# Patient Record
Sex: Male | Born: 1960 | Race: White | Hispanic: No | Smoking: Former smoker
Health system: Southern US, Community
[De-identification: ages and names within clinical notes are randomized; demographics above are authoritative.]

## PROBLEM LIST (undated history)

## (undated) DIAGNOSIS — G8110 Spastic hemiplegia affecting unspecified side: Secondary | ICD-10-CM

## (undated) DIAGNOSIS — F32A Depression, unspecified: Secondary | ICD-10-CM

## (undated) DIAGNOSIS — F329 Major depressive disorder, single episode, unspecified: Secondary | ICD-10-CM

## (undated) DIAGNOSIS — I639 Cerebral infarction, unspecified: Secondary | ICD-10-CM

## (undated) DIAGNOSIS — I69920 Aphasia following unspecified cerebrovascular disease: Secondary | ICD-10-CM

## (undated) DIAGNOSIS — R339 Retention of urine, unspecified: Secondary | ICD-10-CM

## (undated) DIAGNOSIS — I6330 Cerebral infarction due to thrombosis of unspecified cerebral artery: Secondary | ICD-10-CM

## (undated) DIAGNOSIS — K219 Gastro-esophageal reflux disease without esophagitis: Secondary | ICD-10-CM

## (undated) HISTORY — DX: Major depressive disorder, single episode, unspecified: F32.9

## (undated) HISTORY — DX: Depression, unspecified: F32.A

## (undated) HISTORY — DX: Retention of urine, unspecified: R33.9

## (undated) HISTORY — DX: Cerebral infarction, unspecified: I63.9

## (undated) HISTORY — DX: Spastic hemiplegia affecting unspecified side: G81.10

## (undated) HISTORY — DX: Gastro-esophageal reflux disease without esophagitis: K21.9

## (undated) HISTORY — PX: CRANIOTOMY: SHX93

## (undated) HISTORY — DX: Aphasia following unspecified cerebrovascular disease: I69.920

## (undated) HISTORY — DX: Cerebral infarction due to thrombosis of unspecified cerebral artery: I63.30

---

## 2003-09-20 ENCOUNTER — Observation Stay (HOSPITAL_COMMUNITY): Admission: RE | Admit: 2003-09-20 | Discharge: 2003-09-21 | Payer: Self-pay | Admitting: General Surgery

## 2009-12-12 ENCOUNTER — Emergency Department (HOSPITAL_COMMUNITY): Admission: EM | Admit: 2009-12-12 | Discharge: 2009-12-12 | Payer: Self-pay | Admitting: Emergency Medicine

## 2009-12-12 ENCOUNTER — Inpatient Hospital Stay (HOSPITAL_COMMUNITY): Admission: EM | Admit: 2009-12-12 | Discharge: 2010-01-15 | Payer: Self-pay | Admitting: Emergency Medicine

## 2009-12-12 ENCOUNTER — Ambulatory Visit: Payer: Self-pay | Admitting: Pulmonary Disease

## 2009-12-13 ENCOUNTER — Encounter (INDEPENDENT_AMBULATORY_CARE_PROVIDER_SITE_OTHER): Payer: Self-pay | Admitting: Neurology

## 2009-12-28 ENCOUNTER — Ambulatory Visit: Payer: Self-pay | Admitting: Physical Medicine & Rehabilitation

## 2010-01-30 ENCOUNTER — Encounter: Admission: RE | Admit: 2010-01-30 | Discharge: 2010-01-30 | Payer: Self-pay | Admitting: Neurosurgery

## 2010-03-14 ENCOUNTER — Encounter: Admission: RE | Admit: 2010-03-14 | Discharge: 2010-03-14 | Payer: Self-pay | Admitting: Neurosurgery

## 2010-03-25 ENCOUNTER — Inpatient Hospital Stay (HOSPITAL_COMMUNITY): Admission: RE | Admit: 2010-03-25 | Discharge: 2010-04-08 | Payer: Self-pay | Admitting: Neurosurgery

## 2010-03-28 ENCOUNTER — Ambulatory Visit: Payer: Self-pay | Admitting: Physical Medicine & Rehabilitation

## 2010-10-14 ENCOUNTER — Encounter
Admission: RE | Admit: 2010-10-14 | Discharge: 2010-11-28 | Payer: Self-pay | Source: Home / Self Care | Attending: Neurosurgery | Admitting: Neurosurgery

## 2010-11-20 ENCOUNTER — Encounter: Admission: RE | Admit: 2010-11-20 | Payer: Self-pay | Source: Home / Self Care | Admitting: Neurosurgery

## 2010-12-04 ENCOUNTER — Encounter
Admission: RE | Admit: 2010-12-04 | Discharge: 2010-12-31 | Payer: Self-pay | Source: Home / Self Care | Attending: Neurosurgery | Admitting: Neurosurgery

## 2010-12-11 IMAGING — CT CT HEAD WO/W CM
3 of 5 series · 17 of 30 positions shown, 19 images · IV contrast (agent unspecified)
Comparison: 12/19/2009.

CLINICAL DATA: Intracranial hemorrhage follow-up.

CT HEAD WITHOUT AND WITH CONTRAST
TECHNIQUE: Contiguous axial images were obtained from the base of
the skull through the vertex without and with intravenous contrast.
Contrast: 80 ml Vmnipaque-011 IV.

[Series 4: head routine 4.8 h60s · axial · 0.46mm/px · z∈[-1079,-957]mm · 6 of 36 slices shown, 8 images (1 of 2)]
[im 6/36  brain]
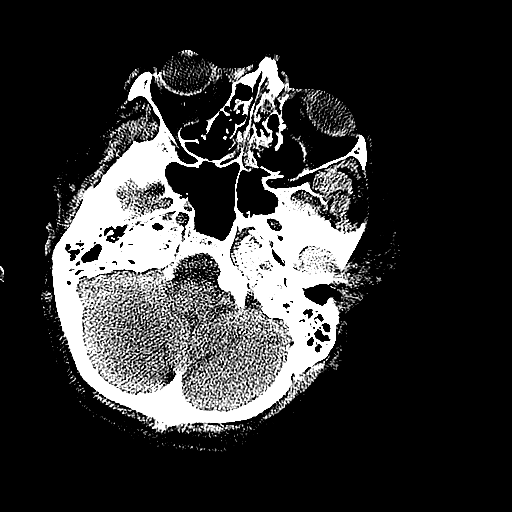
[im 6/36  bone]
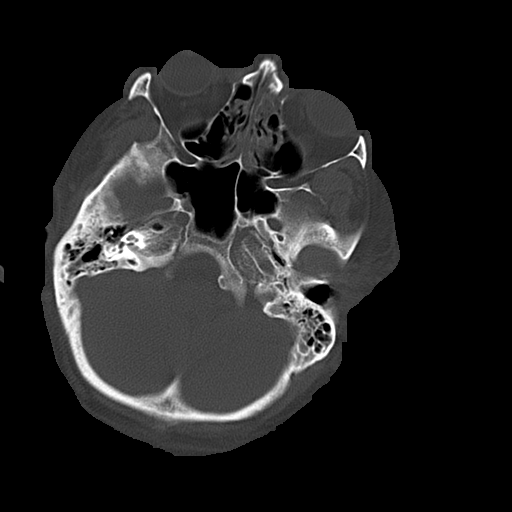
[im 11/36  brain]
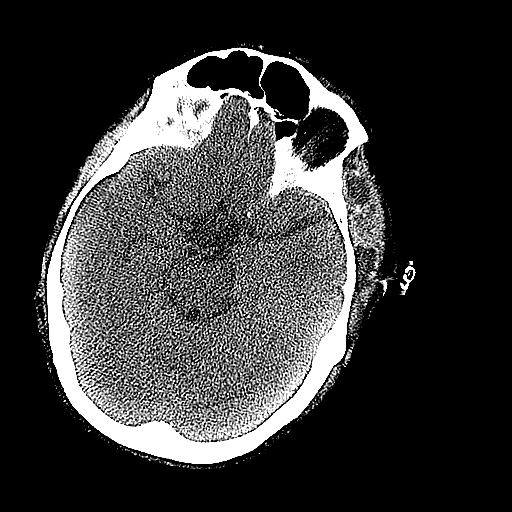
[im 16/36  brain]
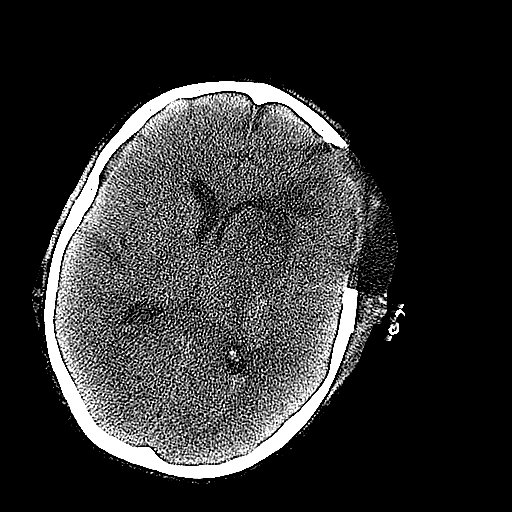
[im 21/36  brain]
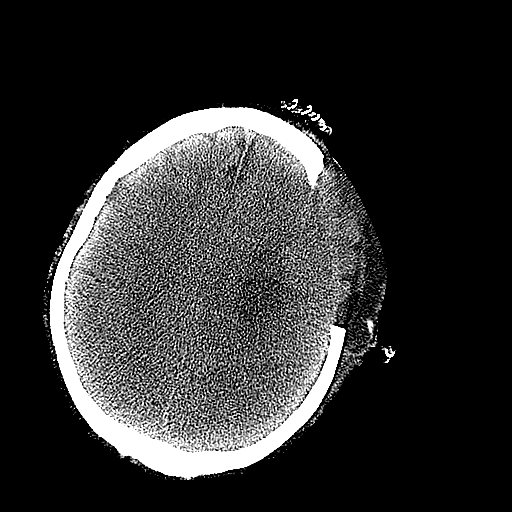
[im 26/36  brain]
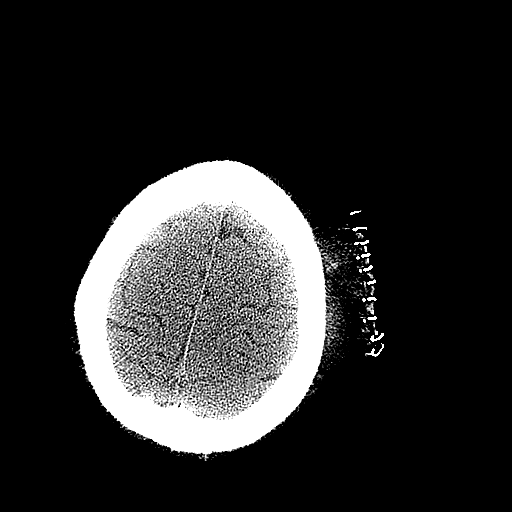
[im 26/36  bone]
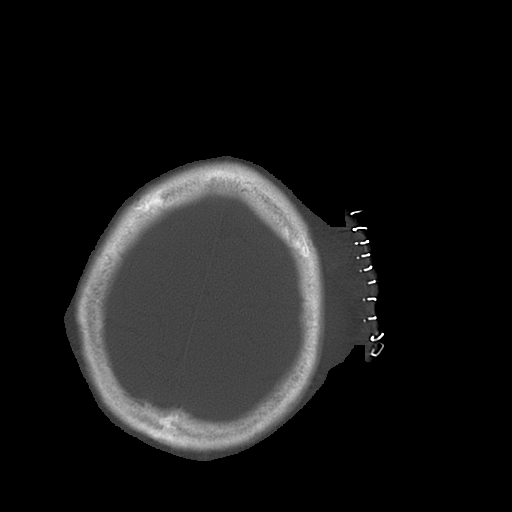
[im 31/36  brain]
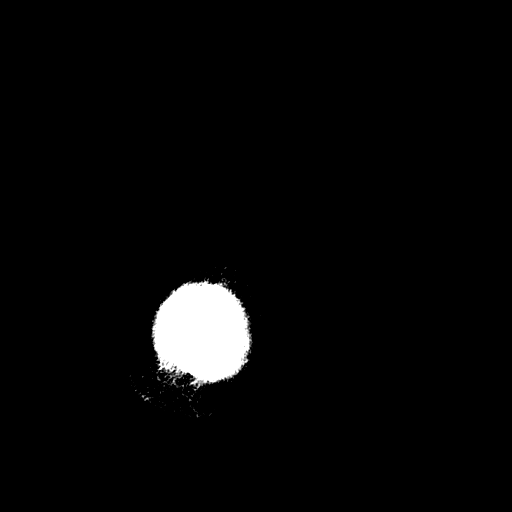

[Series 6: head routine 4.8 h37s · axial · 0.46mm/px · z∈[-1079,-976]mm · 5 of 33 slices shown]
[im 6/33  brain]
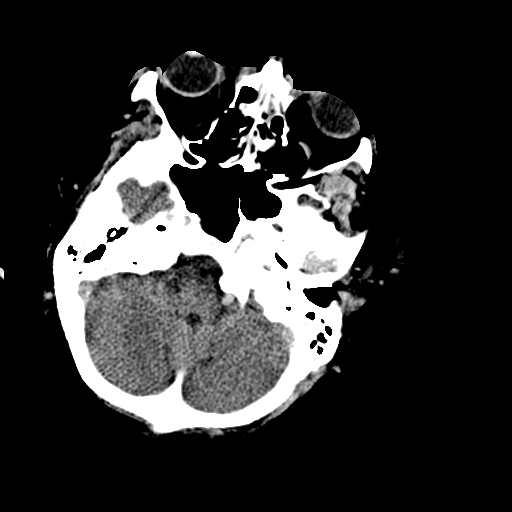
[im 11/33  brain]
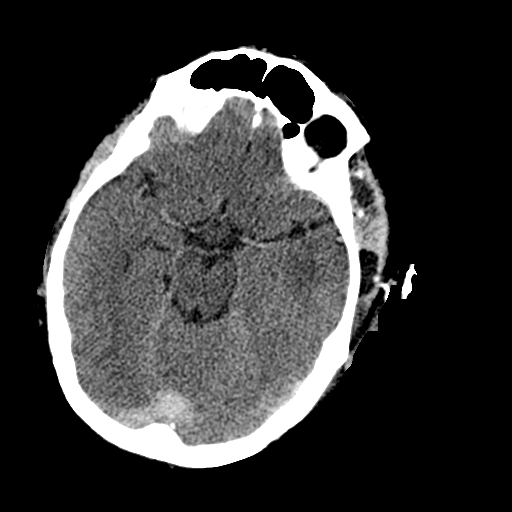
[im 17/33  brain]
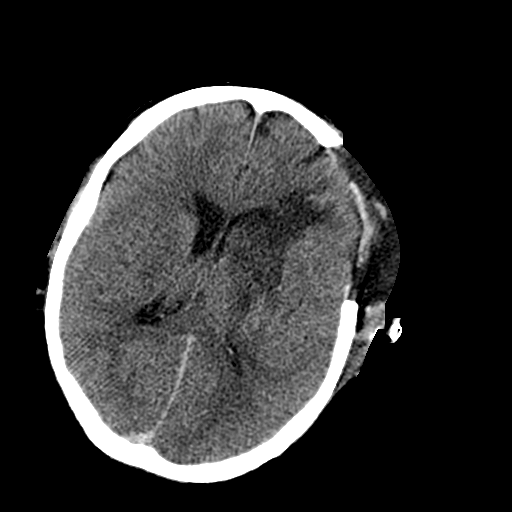
[im 22/33  brain]
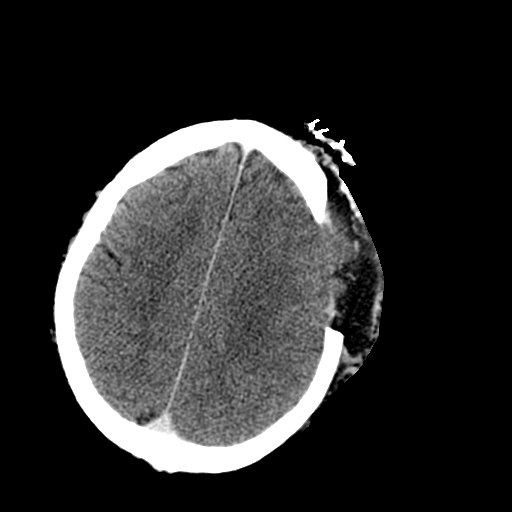
[im 27/33  brain]
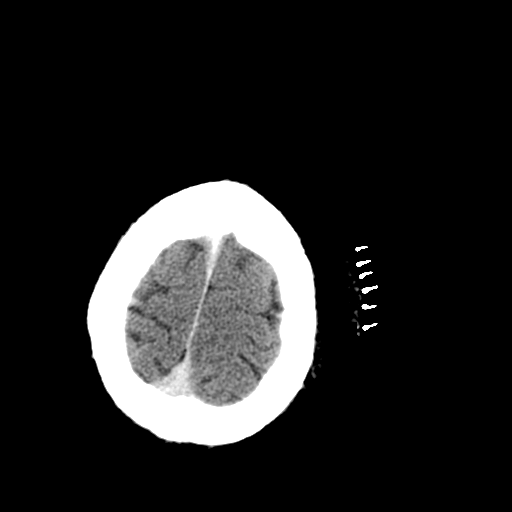

[Series 7: head routine 4.8 h60s · axial · 0.46mm/px · z∈[-1079,-957]mm · 6 of 36 slices shown (2 of 2)]
[im 6/36  brain]
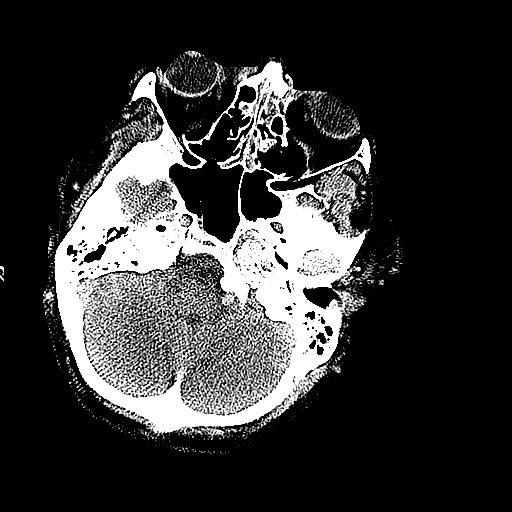
[im 11/36  brain]
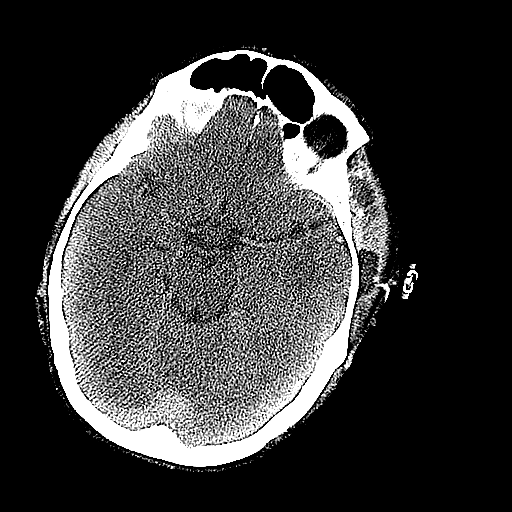
[im 16/36  brain]
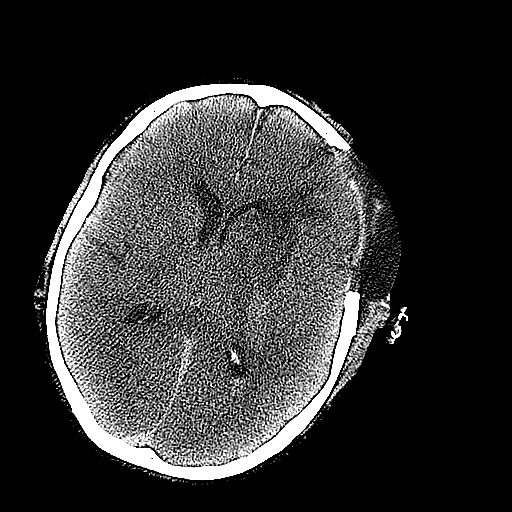
[im 21/36  brain]
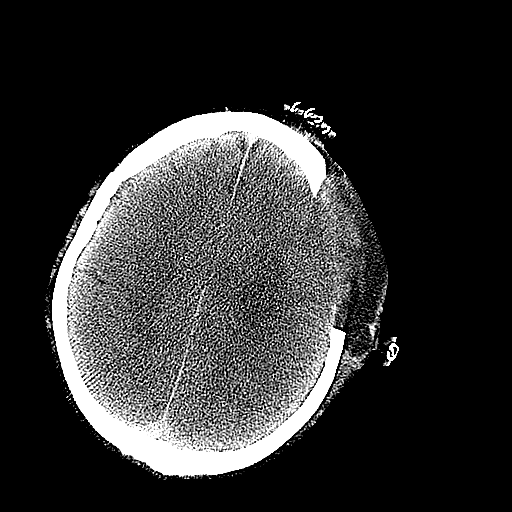
[im 26/36  brain]
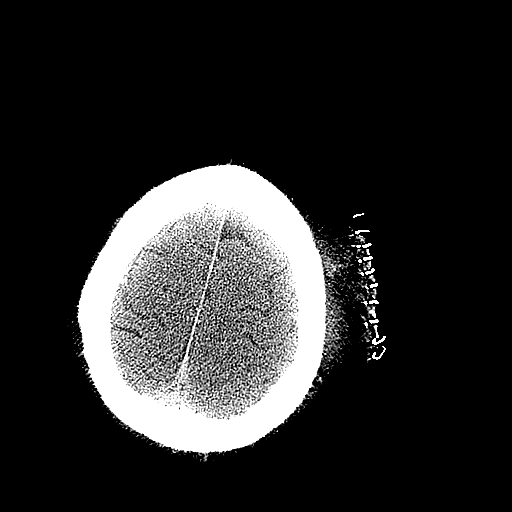
[im 31/36  brain]
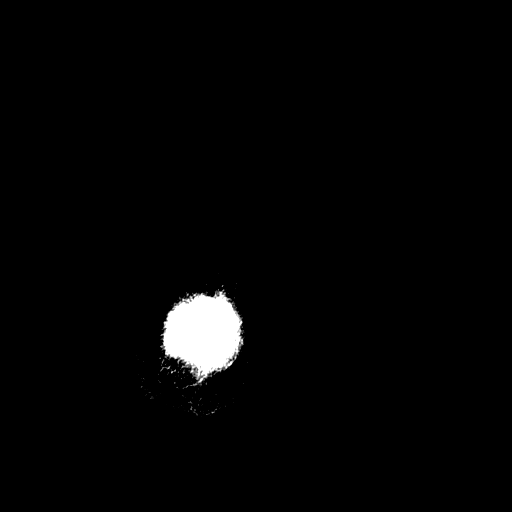

[17 of 30 positions shown; findings below may reference images not displayed]

FINDINGS: Unenhanced images reveal resolving hemorrhage in the left
posterior basal ganglia compared with  the prior study.  Low
density in the left putamen and deep white matter on the left again
noted related to surgery and infarction.  There remains mass effect
on the left lateral ventricle with minimal midline shift.  This is
unchanged.  There remains a small amount of blood in the lateral
ventricles.  No new hemorrhage is identified.

There has been left frontal craniectomy.  Subgaleal CSF collection
mixed  with blood is again noted and unchanged.

Following contrast infusion, no abnormal enhancement is seen.
Specifically no evidence of underlying tumor or vascular
malformation is identified.
IMPRESSION: Resolving left basal ganglia hemorrhage with continued improvement.
There remains significant infarction and edema in the left basal
ganglia and deep white matter.  Mass effect is unchanged.  There is
minimal midline shift.  There remains intraventricular hemorrhage
without hydrocephalus.

No pathologic enhancement is seen.

## 2010-12-13 ENCOUNTER — Encounter (INDEPENDENT_AMBULATORY_CARE_PROVIDER_SITE_OTHER): Payer: Self-pay | Admitting: *Deleted

## 2010-12-13 LAB — CONVERTED CEMR LAB
Bilirubin Urine: NEGATIVE
Glucose, Urine, Semiquant: NEGATIVE
Nitrite: NEGATIVE
Specific Gravity, Urine: 1.03
pH: 6

## 2010-12-18 ENCOUNTER — Encounter: Payer: Self-pay | Admitting: *Deleted

## 2010-12-22 ENCOUNTER — Encounter: Payer: Self-pay | Admitting: Neurology

## 2010-12-22 ENCOUNTER — Encounter: Payer: Self-pay | Admitting: Neurosurgery

## 2010-12-23 ENCOUNTER — Encounter
Admission: RE | Admit: 2010-12-23 | Discharge: 2010-12-30 | Payer: Self-pay | Source: Home / Self Care | Attending: Physical Medicine & Rehabilitation | Admitting: Physical Medicine & Rehabilitation

## 2010-12-25 ENCOUNTER — Encounter: Admit: 2010-12-25 | Payer: Self-pay | Admitting: Neurosurgery

## 2010-12-30 ENCOUNTER — Ambulatory Visit
Admission: RE | Admit: 2010-12-30 | Discharge: 2010-12-30 | Payer: Self-pay | Source: Home / Self Care | Attending: Internal Medicine | Admitting: Internal Medicine

## 2010-12-30 ENCOUNTER — Ambulatory Visit
Admission: RE | Admit: 2010-12-30 | Discharge: 2010-12-30 | Payer: Self-pay | Source: Home / Self Care | Attending: Physical Medicine & Rehabilitation | Admitting: Physical Medicine & Rehabilitation

## 2010-12-30 ENCOUNTER — Other Ambulatory Visit: Payer: Self-pay | Admitting: Internal Medicine

## 2010-12-30 DIAGNOSIS — N390 Urinary tract infection, site not specified: Secondary | ICD-10-CM | POA: Insufficient documentation

## 2010-12-30 DIAGNOSIS — R635 Abnormal weight gain: Secondary | ICD-10-CM | POA: Insufficient documentation

## 2010-12-30 DIAGNOSIS — K219 Gastro-esophageal reflux disease without esophagitis: Secondary | ICD-10-CM | POA: Insufficient documentation

## 2010-12-30 DIAGNOSIS — F3289 Other specified depressive episodes: Secondary | ICD-10-CM | POA: Insufficient documentation

## 2010-12-30 DIAGNOSIS — R32 Unspecified urinary incontinence: Secondary | ICD-10-CM | POA: Insufficient documentation

## 2010-12-30 DIAGNOSIS — Z8679 Personal history of other diseases of the circulatory system: Secondary | ICD-10-CM | POA: Insufficient documentation

## 2010-12-30 DIAGNOSIS — F329 Major depressive disorder, single episode, unspecified: Secondary | ICD-10-CM | POA: Insufficient documentation

## 2010-12-30 DIAGNOSIS — I6350 Cerebral infarction due to unspecified occlusion or stenosis of unspecified cerebral artery: Secondary | ICD-10-CM | POA: Insufficient documentation

## 2010-12-30 LAB — URINALYSIS, ROUTINE W REFLEX MICROSCOPIC
Bilirubin Urine: NEGATIVE
Ketones, ur: NEGATIVE
Nitrite: NEGATIVE
Total Protein, Urine: NEGATIVE
Urine Glucose: NEGATIVE
pH: 7 (ref 5.0–8.0)

## 2010-12-30 LAB — HEPATIC FUNCTION PANEL
AST: 19 U/L (ref 0–37)
Alkaline Phosphatase: 64 U/L (ref 39–117)
Total Bilirubin: 0.7 mg/dL (ref 0.3–1.2)

## 2010-12-30 LAB — CBC WITH DIFFERENTIAL/PLATELET
Basophils Absolute: 0 10*3/uL (ref 0.0–0.1)
Eosinophils Absolute: 0.1 10*3/uL (ref 0.0–0.7)
Eosinophils Relative: 1.3 % (ref 0.0–5.0)
HCT: 48 % (ref 39.0–52.0)
Hemoglobin: 16.9 g/dL (ref 13.0–17.0)
Lymphs Abs: 1.5 10*3/uL (ref 0.7–4.0)
Monocytes Relative: 9.2 % (ref 3.0–12.0)
Neutro Abs: 4.3 10*3/uL (ref 1.4–7.7)
RDW: 13.1 % (ref 11.5–14.6)

## 2010-12-30 LAB — TSH: TSH: 0.63 u[IU]/mL (ref 0.35–5.50)

## 2010-12-30 LAB — BASIC METABOLIC PANEL
BUN: 11 mg/dL (ref 6–23)
CO2: 30 mEq/L (ref 19–32)
Chloride: 100 mEq/L (ref 96–112)
Glucose, Bld: 87 mg/dL (ref 70–99)
Potassium: 4 mEq/L (ref 3.5–5.1)

## 2010-12-30 LAB — LIPID PANEL: Total CHOL/HDL Ratio: 5

## 2010-12-31 ENCOUNTER — Encounter: Payer: Self-pay | Admitting: Internal Medicine

## 2010-12-31 ENCOUNTER — Telehealth: Payer: Self-pay | Admitting: Internal Medicine

## 2010-12-31 ENCOUNTER — Encounter (INDEPENDENT_AMBULATORY_CARE_PROVIDER_SITE_OTHER): Payer: Self-pay | Admitting: *Deleted

## 2010-12-31 DIAGNOSIS — R319 Hematuria, unspecified: Secondary | ICD-10-CM | POA: Insufficient documentation

## 2010-12-31 LAB — LDL CHOLESTEROL, DIRECT: Direct LDL: 91.1 mg/dL

## 2011-01-01 ENCOUNTER — Ambulatory Visit: Payer: PRIVATE HEALTH INSURANCE | Attending: Neurosurgery

## 2011-01-01 ENCOUNTER — Ambulatory Visit: Payer: PRIVATE HEALTH INSURANCE | Admitting: Rehabilitative and Restorative Service Providers"

## 2011-01-01 DIAGNOSIS — Z5189 Encounter for other specified aftercare: Secondary | ICD-10-CM | POA: Insufficient documentation

## 2011-01-01 DIAGNOSIS — R269 Unspecified abnormalities of gait and mobility: Secondary | ICD-10-CM | POA: Insufficient documentation

## 2011-01-01 DIAGNOSIS — M6281 Muscle weakness (generalized): Secondary | ICD-10-CM | POA: Insufficient documentation

## 2011-01-01 DIAGNOSIS — I69920 Aphasia following unspecified cerebrovascular disease: Secondary | ICD-10-CM | POA: Insufficient documentation

## 2011-01-01 DIAGNOSIS — I69998 Other sequelae following unspecified cerebrovascular disease: Secondary | ICD-10-CM | POA: Insufficient documentation

## 2011-01-01 DIAGNOSIS — R482 Apraxia: Secondary | ICD-10-CM | POA: Insufficient documentation

## 2011-01-03 ENCOUNTER — Ambulatory Visit: Payer: Self-pay | Admitting: Rehabilitative and Restorative Service Providers"

## 2011-01-06 ENCOUNTER — Ambulatory Visit: Payer: PRIVATE HEALTH INSURANCE | Admitting: Physical Therapy

## 2011-01-06 ENCOUNTER — Ambulatory Visit: Payer: PRIVATE HEALTH INSURANCE

## 2011-01-08 ENCOUNTER — Ambulatory Visit: Payer: PRIVATE HEALTH INSURANCE

## 2011-01-08 ENCOUNTER — Ambulatory Visit: Payer: PRIVATE HEALTH INSURANCE | Admitting: Physical Therapy

## 2011-01-08 NOTE — Letter (Signed)
Summary: Results Follow-up Letter  [facility]  [address] [place], [place] [address]   Phone: [phone]  Fax: [phone]    12/31/2010  [address] [place], [place]  [address]  Dear Mr. Gravelle,   The following are the results of your recent test(s):  Test     Result     Urine       trace of blood Liver/kidney   normal CBC       normal Thyroid     normal  _________________________________________________________  Please call for an appointment as directed _________________________________________________________ _________________________________________________________ _________________________________________________________  Sincerely,  Sanda Linger MD [facility]

## 2011-01-08 NOTE — Letter (Signed)
Summary: Lipid Letter  [facility]  [address] [place], [place] [address]   Phone: [phone]  Fax: [phone]    12/31/2010  Joseph [name] [address] [place], [place]  [address]  Dear Joseph [name]:  We have carefully reviewed your last lipid profile from  and the results are noted below with a summary of recommendations for lipid management.    Cholesterol:       155     Goal: <200   HDL "good" Cholesterol:   57.84     Goal: >40   LDL "bad" Cholesterol:   91     Goal: <130   Triglycerides:       386.0     Goal: <150 wow        TLC Diet (Therapeutic Lifestyle Change): Saturated Fats & Transfatty acids should be kept < 7% of total calories ***Reduce Saturated Fats Polyunstaurated Fat can be up to 10% of total calories Monounsaturated Fat Fat can be up to 20% of total calories Total Fat should be no greater than 25-35% of total calories Carbohydrates should be 50-60% of total calories Protein should be approximately 15% of total calories Fiber should be at least 20-30 grams a day ***Increased fiber may help lower LDL Total Cholesterol should be < 200mg /day Consider adding plant stanol/sterols to diet (example: Benacol spread) ***A higher intake of unsaturated fat may reduce Triglycerides and Increase HDL    Adjunctive Measures (may lower LIPIDS and reduce risk of Heart Attack) include: Aerobic Exercise (20-30 minutes 3-4 times a week) Limit Alcohol Consumption Weight Reduction Aspirin 75-81 mg a day by mouth (if not allergic or contraindicated) Dietary Fiber 20-30 grams a day by mouth     Current Medications: 1)    Cymbalta 30 Mg Cpep (Duloxetine hcl) .... One by mouth once daily  If you have any questions, please call. We appreciate being able to work with you.   Sincerely,    [facility] Etta Grandchild MD

## 2011-01-08 NOTE — Letter (Addendum)
Summary: Primary Care Consult Scheduled Letter  [facility]  [address] [place], [place] [address]   Phone: [phone]  Fax: [phone]      12/31/2010 MRN: [id number]  Joseph [name] [address] [place], [place]  [address]    Dear [name],      We have scheduled an appointment for you. At the recommendation of Dr.Jones, we have scheduled you a consult with [facility](Dr.Otlelin) on Feb 20,2012 at1:00PM Their address is [address] [place] [address] [facility] [place], [place] [address] The office phone number is [phone].If this appointment day and time is not convenient for you, please feel free to call the office of the doctor you are being referred to at the number listed above and reschedule the appointment.     It is important for you to keep your scheduled appointments. We are here to make sure you are given good patient care. If you have questions or you have made changes to your appointment, please notify us at  [phone]         , ask for   debra           .   Thank you,  Patient Care Coordinator [facility]

## 2011-01-08 NOTE — Progress Notes (Signed)
     New Problems: HEMATURIA UNSPECIFIED (ICD-599.70)   New Problems: HEMATURIA UNSPECIFIED (ICD-599.70)

## 2011-01-08 NOTE — Assessment & Plan Note (Signed)
Summary: NEW / INCLUSIVE HEALTH/#/CD   Vital Signs:  Patient profile:   50 year old male Height:      74 inches Weight:      234.75 pounds BMI:     30.25 O2 Sat:      96 % on Room air Temp:     98.5 degrees F oral Pulse rate:   74 / minute Pulse rhythm:   regular Resp:     16 per minute BP sitting:   118 / 72  (left arm) Cuff size:   large  Vitals Entered By: Rock Nephew CMA (December 30, 2010 4:28 PM)  O2 Flow:  Room air CC: New to establish Is Patient Diabetic? No Pain Assessment Patient in pain? no        Primary Care Provider:  Etta Grandchild MD  CC:  New to establish.  History of Present Illness: New to me he needs a new PCP. 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 Dr. Wynetta Emery did surgery on him. He is doing rehab with Dr. Riley Kill. His caregivers did not bring his meds list today but they think he is on protonix, ambien, and cipro for a UTI. He is seeing a Insurance underwriter soon. He has had some weight gain, depression , and lack of motivation lately.  Preventive Screening-Counseling & Management  Alcohol-Tobacco     Alcohol drinks/day: 0     Alcohol Counseling: not indicated; patient does not drink     Smoking Status: quit > 6 months     Smoke Cessation Stage: quit     Year Quit: 2010  Hep-HIV-STD-Contraception     Hepatitis Risk: no risk noted     HIV Risk: no risk noted     STD Risk: no risk noted  Medications Prior to Update: 1)  Protonix 40 Mg Tbec (Pantoprazole Sodium) 2)  Pyridium 200 Mg Tabs (Phenazopyridine Hcl) .... Take 1 Tablet By Mouth Three Times A Day 3)  Flomax 0.4 Mg Caps (Tamsulosin Hcl) .... Take 1 Tablet By Mouth Once A Day 4)  Cipro 500 Mg Tabs (Ciprofloxacin Hcl) .... Take 1 Tablet By Mouth Two Times A Day As Directed  Current Medications (verified): 1)  None  Allergies (verified): 1)  ! Penicillin  Past History:  Past Medical History: Depression GERD Cerebrovascular accident, hx  of Urinary incontinence  Past Surgical History: Craniotomy  Social History: Smoking Status:  quit > 6 months Hepatitis Risk:  no risk noted HIV Risk:  no risk noted STD Risk:  no risk noted  Review of Systems       The patient complains of weight gain, difficulty walking, and depression.  The patient denies anorexia, fever, weight loss, chest pain, syncope, dyspnea on exertion, peripheral edema, prolonged cough, headaches, hemoptysis, abdominal pain, hematuria, unusual weight change, abnormal bleeding, enlarged lymph nodes, and angioedema.    Physical Exam  General:  alert, well-developed, well-nourished, well-hydrated, appropriate dress, normal appearance, healthy-appearing, cooperative to examination, and good hygiene.   Head:  normocephalic, atraumatic, no abnormalities observed, and no abnormalities palpated.   Eyes:  vision grossly intact, pupils equal, pupils round, pupils reactive to light, pupils react to accomodation, and no nystagmus.   Ears:  R ear normal and L ear normal.   Nose:  External nasal examination shows no deformity or inflammation. Nasal mucosa are pink and moist without lesions or exudates. Mouth:  Oral mucosa and oropharynx without lesions or exudates.  Teeth in good repair. Neck:  supple, full ROM, no masses, no thyromegaly, no JVD, normal carotid upstroke, no carotid bruits, no cervical lymphadenopathy, and no neck tenderness.   Lungs:  normal respiratory effort, no intercostal retractions, no accessory muscle use, normal breath sounds, no dullness, no fremitus, no crackles, and no wheezes.   Heart:  normal rate, regular rhythm, no murmur, no gallop, no rub, and no JVD.   Abdomen:  soft, non-tender, normal bowel sounds, no distention, no masses, no guarding, no rigidity, no rebound tenderness, no abdominal hernia, no inguinal hernia, no hepatomegaly, and no splenomegaly.   Msk:  normal ROM, no joint tenderness, no joint swelling, no joint warmth, no redness over  joints, no joint deformities, no joint instability, no crepitation, and no muscle atrophy.   Pulses:  R and L carotid,radial,femoral,dorsalis pedis and posterior tibial pulses are full and equal bilaterally Extremities:  No clubbing, cyanosis, edema, or deformity noted with normal full range of motion of all joints.   Neurologic:  alert & oriented X3, RUE hyperreflexia, RUE weakness, RUE sensory loss, RLE hyperreflexia, RLE weakness, and RLE sensory loss.   Skin:  turgor normal, color normal, no rashes, no suspicious lesions, no ecchymoses, no petechiae, no purpura, no ulcerations, and no edema.   Cervical Nodes:  no anterior cervical adenopathy and no posterior cervical adenopathy.   Axillary Nodes:  no R axillary adenopathy and no L axillary adenopathy.   Inguinal Nodes:  no R inguinal adenopathy and no L inguinal adenopathy.   Psych:  Cognition and judgment appear intact. Alert and cooperative with normal attention span and concentration. No apparent delusions, illusions, hallucinations   Impression & Recommendations:  Problem # 1:  DEPRESSION (ICD-311) Assessment New  His updated medication list for this problem includes:    Cymbalta 30 Mg Cpep (Duloxetine hcl) ..... One by mouth once daily  Problem # 2:  C V A/STROKE (ICD-434.91) Assessment: Unchanged  Orders: Venipuncture (86578) TLB-Lipid Panel (80061-LIPID) TLB-BMP (Basic Metabolic Panel-BMET) (80048-METABOL) TLB-Hepatic/Liver Function Pnl (80076-HEPATIC) TLB-TSH (Thyroid Stimulating Hormone) (84443-TSH) TLB-CBC Platelet - w/Differential (85025-CBCD) TLB-Udip w/ Micro (81001-URINE) Nutrition Referral (Nutrition)  Problem # 3:  WEIGHT GAIN (ICD-783.1) Assessment: Unchanged  Orders: Venipuncture (46962) TLB-Lipid Panel (80061-LIPID) TLB-BMP (Basic Metabolic Panel-BMET) (80048-METABOL) TLB-Hepatic/Liver Function Pnl (80076-HEPATIC) TLB-TSH (Thyroid Stimulating Hormone) (84443-TSH) TLB-CBC Platelet - w/Differential  (85025-CBCD) TLB-Udip w/ Micro (81001-URINE) Nutrition Referral (Nutrition)  Complete Medication List: 1)  Cymbalta 30 Mg Cpep (Duloxetine hcl) .... One by mouth once daily  Patient Instructions: 1)  Please schedule a follow-up appointment in 2 months. Prescriptions: CYMBALTA 30 MG CPEP (DULOXETINE HCL) One by mouth once daily  #70 x 0   Entered and Authorized by:   Etta Grandchild MD   Signed by:   Etta Grandchild MD on 12/30/2010   Method used:   Samples Given   RxID:   [id number]    Orders Added: 1)  Venipuncture [36415] 2)  TLB-Lipid Panel [80061-LIPID] 3)  TLB-BMP (Basic Metabolic Panel-BMET) [80048-METABOL] 4)  TLB-Hepatic/Liver Function Pnl [80076-HEPATIC] 5)  TLB-TSH (Thyroid Stimulating Hormone) [84443-TSH] 6)  TLB-CBC Platelet - w/Differential [85025-CBCD] 7)  TLB-Udip w/ Micro [81001-URINE] 8)  Nutrition Referral [Nutrition] 9)  New Patient Level IV [53664]   Immunization History:  Tetanus/Td Immunization History:    Tetanus/Td:  historical (12/02/2002)   Immunization History:  Tetanus/Td Immunization History:    Tetanus/Td:  Historical (12/02/2002)    Prevention & Chronic Care Immunizations   Influenza vaccine: Not documented   Influenza vaccine  deferral: Refused  (12/30/2010)    Tetanus booster: 12/02/2002: Historical    Pneumococcal vaccine: Not documented  Other Screening   Smoking status: quit > 6 months  (12/30/2010)  Lipids   Total Cholesterol: Not documented   LDL: Not documented   LDL Direct: Not documented   HDL: Not documented   Triglycerides: Not documented

## 2011-01-10 ENCOUNTER — Ambulatory Visit: Payer: Self-pay | Admitting: Rehabilitative and Restorative Service Providers"

## 2011-01-13 ENCOUNTER — Encounter: Admit: 2011-01-13 | Payer: Self-pay | Admitting: Neurosurgery

## 2011-01-13 ENCOUNTER — Ambulatory Visit: Payer: PRIVATE HEALTH INSURANCE | Admitting: Physical Therapy

## 2011-01-15 ENCOUNTER — Ambulatory Visit: Payer: PRIVATE HEALTH INSURANCE

## 2011-01-15 ENCOUNTER — Ambulatory Visit: Payer: PRIVATE HEALTH INSURANCE | Admitting: Physical Therapy

## 2011-01-17 ENCOUNTER — Ambulatory Visit: Payer: Self-pay | Admitting: Rehabilitative and Restorative Service Providers"

## 2011-01-20 ENCOUNTER — Encounter: Payer: Self-pay | Admitting: Internal Medicine

## 2011-01-22 ENCOUNTER — Ambulatory Visit (HOSPITAL_BASED_OUTPATIENT_CLINIC_OR_DEPARTMENT_OTHER): Payer: PRIVATE HEALTH INSURANCE | Admitting: Physical Medicine & Rehabilitation

## 2011-01-22 ENCOUNTER — Ambulatory Visit: Payer: PRIVATE HEALTH INSURANCE | Admitting: Physical Therapy

## 2011-01-22 ENCOUNTER — Ambulatory Visit: Payer: PRIVATE HEALTH INSURANCE

## 2011-01-22 ENCOUNTER — Encounter: Payer: PRIVATE HEALTH INSURANCE | Attending: Physical Medicine & Rehabilitation

## 2011-01-22 DIAGNOSIS — I69959 Hemiplegia and hemiparesis following unspecified cerebrovascular disease affecting unspecified side: Secondary | ICD-10-CM | POA: Insufficient documentation

## 2011-01-22 DIAGNOSIS — R339 Retention of urine, unspecified: Secondary | ICD-10-CM

## 2011-01-22 DIAGNOSIS — I69920 Aphasia following unspecified cerebrovascular disease: Secondary | ICD-10-CM | POA: Insufficient documentation

## 2011-01-22 DIAGNOSIS — I6330 Cerebral infarction due to thrombosis of unspecified cerebral artery: Secondary | ICD-10-CM

## 2011-01-22 DIAGNOSIS — G8110 Spastic hemiplegia affecting unspecified side: Secondary | ICD-10-CM

## 2011-01-22 DIAGNOSIS — R350 Frequency of micturition: Secondary | ICD-10-CM | POA: Insufficient documentation

## 2011-01-22 DIAGNOSIS — T481X5A Adverse effect of skeletal muscle relaxants [neuromuscular blocking agents], initial encounter: Secondary | ICD-10-CM | POA: Insufficient documentation

## 2011-01-27 ENCOUNTER — Ambulatory Visit: Payer: PRIVATE HEALTH INSURANCE | Admitting: Physical Therapy

## 2011-01-27 ENCOUNTER — Ambulatory Visit: Payer: PRIVATE HEALTH INSURANCE

## 2011-01-28 NOTE — Letter (Signed)
Summary: Ihor Gully MD/[facility]  Ihor Gully MD/[facility]   Imported By: Lester Sherrill 01/23/2011 10:56:26  _____________________________________________________________________  External Attachment:    Type:   Image     Comment:   External Document

## 2011-01-29 ENCOUNTER — Ambulatory Visit: Payer: PRIVATE HEALTH INSURANCE | Admitting: Physical Therapy

## 2011-02-05 ENCOUNTER — Ambulatory Visit: Payer: PRIVATE HEALTH INSURANCE | Attending: Neurosurgery | Admitting: Physical Therapy

## 2011-02-05 ENCOUNTER — Ambulatory Visit: Payer: PRIVATE HEALTH INSURANCE

## 2011-02-05 DIAGNOSIS — I69920 Aphasia following unspecified cerebrovascular disease: Secondary | ICD-10-CM | POA: Insufficient documentation

## 2011-02-05 DIAGNOSIS — I69998 Other sequelae following unspecified cerebrovascular disease: Secondary | ICD-10-CM | POA: Insufficient documentation

## 2011-02-05 DIAGNOSIS — R269 Unspecified abnormalities of gait and mobility: Secondary | ICD-10-CM | POA: Insufficient documentation

## 2011-02-05 DIAGNOSIS — M6281 Muscle weakness (generalized): Secondary | ICD-10-CM | POA: Insufficient documentation

## 2011-02-05 DIAGNOSIS — Z5189 Encounter for other specified aftercare: Secondary | ICD-10-CM | POA: Insufficient documentation

## 2011-02-05 DIAGNOSIS — R482 Apraxia: Secondary | ICD-10-CM | POA: Insufficient documentation

## 2011-02-07 ENCOUNTER — Ambulatory Visit: Payer: PRIVATE HEALTH INSURANCE | Admitting: Physical Therapy

## 2011-02-07 ENCOUNTER — Encounter: Payer: Self-pay | Admitting: Internal Medicine

## 2011-02-10 ENCOUNTER — Ambulatory Visit: Payer: PRIVATE HEALTH INSURANCE | Admitting: Physical Therapy

## 2011-02-12 ENCOUNTER — Ambulatory Visit: Payer: PRIVATE HEALTH INSURANCE | Admitting: Physical Therapy

## 2011-02-14 ENCOUNTER — Encounter: Payer: PRIVATE HEALTH INSURANCE | Attending: Internal Medicine | Admitting: *Deleted

## 2011-02-14 DIAGNOSIS — Z713 Dietary counseling and surveillance: Secondary | ICD-10-CM | POA: Insufficient documentation

## 2011-02-14 DIAGNOSIS — R635 Abnormal weight gain: Secondary | ICD-10-CM | POA: Insufficient documentation

## 2011-02-16 LAB — PHOSPHORUS
Phosphorus: 1.5 mg/dL — ABNORMAL LOW (ref 2.3–4.6)
Phosphorus: 2.6 mg/dL (ref 2.3–4.6)

## 2011-02-16 LAB — GLUCOSE, CAPILLARY
Glucose-Capillary: 104 mg/dL — ABNORMAL HIGH (ref 70–99)
Glucose-Capillary: 106 mg/dL — ABNORMAL HIGH (ref 70–99)
Glucose-Capillary: 112 mg/dL — ABNORMAL HIGH (ref 70–99)
Glucose-Capillary: 119 mg/dL — ABNORMAL HIGH (ref 70–99)
Glucose-Capillary: 121 mg/dL — ABNORMAL HIGH (ref 70–99)
Glucose-Capillary: 126 mg/dL — ABNORMAL HIGH (ref 70–99)
Glucose-Capillary: 130 mg/dL — ABNORMAL HIGH (ref 70–99)
Glucose-Capillary: 207 mg/dL — ABNORMAL HIGH (ref 70–99)
Glucose-Capillary: 99 mg/dL (ref 70–99)

## 2011-02-16 LAB — BLOOD GAS, ARTERIAL
Bicarbonate: 22.9 mEq/L (ref 20.0–24.0)
Bicarbonate: 23.6 mEq/L (ref 20.0–24.0)
MECHVT: 600 mL
MECHVT: 650 mL
O2 Saturation: 98.7 %
O2 Saturation: 99.6 %
PEEP: 5 cmH2O
Patient temperature: 98.6
Patient temperature: 98.6
TCO2: 24 mmol/L (ref 0–100)
TCO2: 24.7 mmol/L (ref 0–100)
pCO2 arterial: 36.2 mmHg (ref 35.0–45.0)
pH, Arterial: 7.413 (ref 7.350–7.450)
pH, Arterial: 7.43 (ref 7.350–7.450)

## 2011-02-16 LAB — POCT I-STAT 3, ART BLOOD GAS (G3+)
pCO2 arterial: 40.1 mmHg (ref 35.0–45.0)
pH, Arterial: 7.382 (ref 7.350–7.450)
pO2, Arterial: 462 mmHg — ABNORMAL HIGH (ref 80.0–100.0)

## 2011-02-16 LAB — URINE MICROSCOPIC-ADD ON

## 2011-02-16 LAB — COMPREHENSIVE METABOLIC PANEL
ALT: 17 U/L (ref 0–53)
BUN: 9 mg/dL (ref 6–23)
CO2: 28 mEq/L (ref 19–32)
Calcium: 8.9 mg/dL (ref 8.4–10.5)
Creatinine, Ser: 0.81 mg/dL (ref 0.4–1.5)
GFR calc Af Amer: >60 mL/min (ref >60)
GFR calc non Af Amer: >60 mL/min (ref >60)
Glucose, Bld: 94 mg/dL (ref 70–99)
Total Protein: 6.5 g/dL (ref 6.0–8.3)

## 2011-02-16 LAB — BASIC METABOLIC PANEL
BUN: 14 mg/dL (ref 6–23)
BUN: 8 mg/dL (ref 6–23)
CO2: 23 mEq/L (ref 19–32)
CO2: 23 mEq/L (ref 19–32)
Calcium: 8 mg/dL — ABNORMAL LOW (ref 8.4–10.5)
Calcium: 8.2 mg/dL — ABNORMAL LOW (ref 8.4–10.5)
Chloride: 112 mEq/L (ref 96–112)
Chloride: 114 mEq/L — ABNORMAL HIGH (ref 96–112)
Creatinine, Ser: 0.68 mg/dL (ref 0.4–1.5)
Creatinine, Ser: 0.71 mg/dL (ref 0.4–1.5)
Creatinine, Ser: 0.72 mg/dL (ref 0.4–1.5)
GFR calc Af Amer: >60 mL/min (ref >60)
GFR calc Af Amer: >60 mL/min (ref >60)
GFR calc non Af Amer: >60 mL/min (ref >60)
Potassium: 3.3 mEq/L — ABNORMAL LOW (ref 3.5–5.1)
Sodium: 147 mEq/L — ABNORMAL HIGH (ref 135–145)

## 2011-02-16 LAB — URINALYSIS, ROUTINE W REFLEX MICROSCOPIC
Bilirubin Urine: NEGATIVE
Protein, ur: NEGATIVE mg/dL
Urobilinogen, UA: 0.2 mg/dL (ref 0.0–1.0)

## 2011-02-16 LAB — PROTIME-INR
INR: 1.08 (ref 0.00–1.49)
INR: 1.09 (ref 0.00–1.49)
Prothrombin Time: 13.9 seconds (ref 11.6–15.2)
Prothrombin Time: 14 seconds (ref 11.6–15.2)

## 2011-02-16 LAB — RAPID URINE DRUG SCREEN, HOSP PERFORMED
Barbiturates: NOT DETECTED
Cocaine: NOT DETECTED
Opiates: POSITIVE — AB
Tetrahydrocannabinol: NOT DETECTED

## 2011-02-16 LAB — SODIUM
Sodium: 145 mEq/L (ref 135–145)
Sodium: 148 mEq/L — ABNORMAL HIGH (ref 135–145)
Sodium: 149 mEq/L — ABNORMAL HIGH (ref 135–145)

## 2011-02-16 LAB — CROSSMATCH

## 2011-02-16 LAB — CBC
HCT: 45.2 % (ref 39.0–52.0)
Hemoglobin: 14.4 g/dL (ref 13.0–17.0)
Hemoglobin: 15.9 g/dL (ref 13.0–17.0)
MCHC: 35.1 g/dL (ref 30.0–36.0)
MCHC: 35.1 g/dL (ref 30.0–36.0)
MCV: 96.5 fL (ref 78.0–100.0)
MCV: 98 fL (ref 78.0–100.0)
Platelets: 133 10*3/uL — ABNORMAL LOW (ref 150–400)
Platelets: 181 10*3/uL (ref 150–400)
Platelets: 185 10*3/uL (ref 150–400)
RBC: 4.19 MIL/uL — ABNORMAL LOW (ref 4.22–5.81)
RBC: 4.34 MIL/uL (ref 4.22–5.81)
RBC: 4.64 MIL/uL (ref 4.22–5.81)
RDW: 12.7 % (ref 11.5–15.5)
WBC: 12.9 10*3/uL — ABNORMAL HIGH (ref 4.0–10.5)
WBC: 6.5 10*3/uL (ref 4.0–10.5)
WBC: 9.3 10*3/uL (ref 4.0–10.5)

## 2011-02-16 LAB — LIPID PANEL
Cholesterol: 133 mg/dL (ref 0–200)
HDL: 36 mg/dL — ABNORMAL LOW (ref >39)
Total CHOL/HDL Ratio: 3.7 RATIO
VLDL: 15 mg/dL (ref 0–40)

## 2011-02-16 LAB — POCT I-STAT 7, (LYTES, BLD GAS, ICA,H+H)
Calcium, Ion: 1.1 mmol/L — ABNORMAL LOW (ref 1.12–1.32)
HCT: 42 % (ref 39.0–52.0)
Patient temperature: 98.9
Potassium: 3.7 mEq/L (ref 3.5–5.1)
pCO2 arterial: 33 mmHg — ABNORMAL LOW (ref 35.0–45.0)
pH, Arterial: 7.421 (ref 7.350–7.450)
pO2, Arterial: 192 mmHg — ABNORMAL HIGH (ref 80.0–100.0)

## 2011-02-16 LAB — MRSA PCR SCREENING: MRSA by PCR: NEGATIVE

## 2011-02-16 LAB — DIFFERENTIAL
Basophils Absolute: 0 10*3/uL (ref 0.0–0.1)
Basophils Absolute: 0 10*3/uL (ref 0.0–0.1)
Basophils Relative: 0 % (ref 0–1)
Eosinophils Absolute: 0 10*3/uL (ref 0.0–0.7)
Eosinophils Absolute: 0.1 10*3/uL (ref 0.0–0.7)
Eosinophils Relative: 2 % (ref 0–5)
Lymphocytes Relative: 26 % (ref 12–46)
Neutro Abs: 12.2 10*3/uL — ABNORMAL HIGH (ref 1.7–7.7)
Neutro Abs: 4.2 10*3/uL (ref 1.7–7.7)
Neutrophils Relative %: 64 % (ref 43–77)
Neutrophils Relative %: 86 % — ABNORMAL HIGH (ref 43–77)

## 2011-02-16 LAB — TRIGLYCERIDES: Triglycerides: 148 mg/dL (ref <150)

## 2011-02-16 LAB — ABO/RH: ABO/RH(D): AB POS

## 2011-02-16 LAB — OSMOLALITY: Osmolality: 285 mOsm/kg (ref 275–300)

## 2011-02-16 LAB — MAGNESIUM: Magnesium: 2 mg/dL (ref 1.5–2.5)

## 2011-02-16 LAB — APTT: aPTT: 27 seconds (ref 24–37)

## 2011-02-16 LAB — CK TOTAL AND CKMB (NOT AT ARMC): CK, MB: 2.1 ng/mL (ref 0.3–4.0)

## 2011-02-17 ENCOUNTER — Ambulatory Visit (HOSPITAL_BASED_OUTPATIENT_CLINIC_OR_DEPARTMENT_OTHER)
Admission: RE | Admit: 2011-02-17 | Discharge: 2011-02-17 | Disposition: A | Discharge status: Home / Self Care | Payer: PRIVATE HEALTH INSURANCE | Source: Ambulatory Visit | Attending: Urology | Admitting: Urology

## 2011-02-17 DIAGNOSIS — N3941 Urge incontinence: Secondary | ICD-10-CM | POA: Insufficient documentation

## 2011-02-17 DIAGNOSIS — N210 Calculus in bladder: Secondary | ICD-10-CM | POA: Insufficient documentation

## 2011-02-17 DIAGNOSIS — Z8673 Personal history of transient ischemic attack (TIA), and cerebral infarction without residual deficits: Secondary | ICD-10-CM | POA: Insufficient documentation

## 2011-02-17 LAB — BASIC METABOLIC PANEL
BUN: 16 mg/dL (ref 6–23)
BUN: 21 mg/dL (ref 6–23)
CO2: 22 mEq/L (ref 19–32)
CO2: 25 mEq/L (ref 19–32)
CO2: 26 mEq/L (ref 19–32)
CO2: 26 mEq/L (ref 19–32)
CO2: 27 mEq/L (ref 19–32)
Calcium: 7.6 mg/dL — ABNORMAL LOW (ref 8.4–10.5)
Calcium: 7.7 mg/dL — ABNORMAL LOW (ref 8.4–10.5)
Calcium: 7.8 mg/dL — ABNORMAL LOW (ref 8.4–10.5)
Calcium: 7.9 mg/dL — ABNORMAL LOW (ref 8.4–10.5)
Chloride: 105 mEq/L (ref 96–112)
Chloride: 109 mEq/L (ref 96–112)
Chloride: 113 mEq/L — ABNORMAL HIGH (ref 96–112)
Chloride: 119 mEq/L — ABNORMAL HIGH (ref 96–112)
Chloride: 125 mEq/L — ABNORMAL HIGH (ref 96–112)
Creatinine, Ser: 0.48 mg/dL (ref 0.4–1.5)
Creatinine, Ser: 0.54 mg/dL (ref 0.4–1.5)
Creatinine, Ser: 0.55 mg/dL (ref 0.4–1.5)
Creatinine, Ser: 0.6 mg/dL (ref 0.4–1.5)
Creatinine, Ser: 0.73 mg/dL (ref 0.4–1.5)
GFR calc Af Amer: >60 mL/min (ref >60)
GFR calc Af Amer: >60 mL/min (ref >60)
GFR calc Af Amer: >60 mL/min (ref >60)
GFR calc Af Amer: >60 mL/min (ref >60)
GFR calc Af Amer: >60 mL/min (ref >60)
GFR calc Af Amer: >60 mL/min (ref >60)
GFR calc Af Amer: >60 mL/min (ref >60)
GFR calc non Af Amer: >60 mL/min (ref >60)
GFR calc non Af Amer: >60 mL/min (ref >60)
GFR calc non Af Amer: >60 mL/min (ref >60)
GFR calc non Af Amer: >60 mL/min (ref >60)
Glucose, Bld: 121 mg/dL — ABNORMAL HIGH (ref 70–99)
Glucose, Bld: 122 mg/dL — ABNORMAL HIGH (ref 70–99)
Glucose, Bld: 127 mg/dL — ABNORMAL HIGH (ref 70–99)
Glucose, Bld: 131 mg/dL — ABNORMAL HIGH (ref 70–99)
Glucose, Bld: 192 mg/dL — ABNORMAL HIGH (ref 70–99)
Potassium: 3.4 mEq/L — ABNORMAL LOW (ref 3.5–5.1)
Potassium: 3.4 mEq/L — ABNORMAL LOW (ref 3.5–5.1)
Potassium: 3.5 mEq/L (ref 3.5–5.1)
Potassium: 3.6 mEq/L (ref 3.5–5.1)
Potassium: 3.7 mEq/L (ref 3.5–5.1)
Potassium: 3.8 mEq/L (ref 3.5–5.1)
Sodium: 144 mEq/L (ref 135–145)
Sodium: 145 mEq/L (ref 135–145)
Sodium: 147 mEq/L — ABNORMAL HIGH (ref 135–145)
Sodium: 151 mEq/L — ABNORMAL HIGH (ref 135–145)

## 2011-02-17 LAB — CBC
HCT: 33.5 % — ABNORMAL LOW (ref 39.0–52.0)
HCT: 35.6 % — ABNORMAL LOW (ref 39.0–52.0)
HCT: 36.5 % — ABNORMAL LOW (ref 39.0–52.0)
Hemoglobin: 11.3 g/dL — ABNORMAL LOW (ref 13.0–17.0)
Hemoglobin: 11.8 g/dL — ABNORMAL LOW (ref 13.0–17.0)
Hemoglobin: 11.9 g/dL — ABNORMAL LOW (ref 13.0–17.0)
Hemoglobin: 12.4 g/dL — ABNORMAL LOW (ref 13.0–17.0)
Hemoglobin: 12.5 g/dL — ABNORMAL LOW (ref 13.0–17.0)
MCHC: 34.3 g/dL (ref 30.0–36.0)
MCHC: 34.9 g/dL (ref 30.0–36.0)
MCHC: 35.5 g/dL (ref 30.0–36.0)
MCV: 98.3 fL (ref 78.0–100.0)
MCV: 99.1 fL (ref 78.0–100.0)
MCV: 99.6 fL (ref 78.0–100.0)
Platelets: 148 10*3/uL — ABNORMAL LOW (ref 150–400)
RBC: 3.19 MIL/uL — ABNORMAL LOW (ref 4.22–5.81)
RBC: 3.24 MIL/uL — ABNORMAL LOW (ref 4.22–5.81)
RBC: 3.35 MIL/uL — ABNORMAL LOW (ref 4.22–5.81)
RBC: 3.37 MIL/uL — ABNORMAL LOW (ref 4.22–5.81)
RBC: 3.57 MIL/uL — ABNORMAL LOW (ref 4.22–5.81)
RBC: 3.64 MIL/uL — ABNORMAL LOW (ref 4.22–5.81)
RBC: 3.66 MIL/uL — ABNORMAL LOW (ref 4.22–5.81)
RDW: 12.1 % (ref 11.5–15.5)
RDW: 12.3 % (ref 11.5–15.5)
RDW: 12.4 % (ref 11.5–15.5)
RDW: 12.4 % (ref 11.5–15.5)
WBC: 10.2 10*3/uL (ref 4.0–10.5)
WBC: 6.9 10*3/uL (ref 4.0–10.5)
WBC: 7 10*3/uL (ref 4.0–10.5)
WBC: 9.6 10*3/uL (ref 4.0–10.5)
WBC: 9.8 10*3/uL (ref 4.0–10.5)

## 2011-02-17 LAB — GLUCOSE, CAPILLARY
Glucose-Capillary: 102 mg/dL — ABNORMAL HIGH (ref 70–99)
Glucose-Capillary: 116 mg/dL — ABNORMAL HIGH (ref 70–99)
Glucose-Capillary: 122 mg/dL — ABNORMAL HIGH (ref 70–99)
Glucose-Capillary: 122 mg/dL — ABNORMAL HIGH (ref 70–99)
Glucose-Capillary: 123 mg/dL — ABNORMAL HIGH (ref 70–99)
Glucose-Capillary: 124 mg/dL — ABNORMAL HIGH (ref 70–99)
Glucose-Capillary: 126 mg/dL — ABNORMAL HIGH (ref 70–99)
Glucose-Capillary: 130 mg/dL — ABNORMAL HIGH (ref 70–99)
Glucose-Capillary: 131 mg/dL — ABNORMAL HIGH (ref 70–99)
Glucose-Capillary: 131 mg/dL — ABNORMAL HIGH (ref 70–99)
Glucose-Capillary: 132 mg/dL — ABNORMAL HIGH (ref 70–99)
Glucose-Capillary: 134 mg/dL — ABNORMAL HIGH (ref 70–99)
Glucose-Capillary: 134 mg/dL — ABNORMAL HIGH (ref 70–99)
Glucose-Capillary: 136 mg/dL — ABNORMAL HIGH (ref 70–99)
Glucose-Capillary: 137 mg/dL — ABNORMAL HIGH (ref 70–99)
Glucose-Capillary: 168 mg/dL — ABNORMAL HIGH (ref 70–99)
Glucose-Capillary: 56 mg/dL — ABNORMAL LOW (ref 70–99)
Glucose-Capillary: 95 mg/dL (ref 70–99)

## 2011-02-17 LAB — BLOOD GAS, ARTERIAL
Acid-base deficit: 0.8 mmol/L (ref 0.0–2.0)
Acid-base deficit: 0.9 mmol/L (ref 0.0–2.0)
Acid-base deficit: 1 mmol/L (ref 0.0–2.0)
Bicarbonate: 24.8 mEq/L — ABNORMAL HIGH (ref 20.0–24.0)
Bicarbonate: 24.9 mEq/L — ABNORMAL HIGH (ref 20.0–24.0)
Drawn by: 22251
FIO2: 0.3 %
FIO2: 0.3 %
MECHVT: 500 mL
MECHVT: 550 mL
MECHVT: 600 mL
O2 Saturation: 96.8 %
O2 Saturation: 98.5 %
PEEP: 5 cmH2O
PEEP: 5 cmH2O
PEEP: 5 cmH2O
Patient temperature: 101.4
Patient temperature: 98.6
Patient temperature: 98.6
Patient temperature: 98.6
Pressure support: 5 cmH2O
RATE: 14 resp/min
TCO2: 22.7 mmol/L (ref 0–100)
TCO2: 23 mmol/L (ref 0–100)
TCO2: 26.5 mmol/L (ref 0–100)
pCO2 arterial: 28.3 mmHg — ABNORMAL LOW (ref 35.0–45.0)
pCO2 arterial: 42.7 mmHg (ref 35.0–45.0)
pCO2 arterial: 53.5 mmHg — ABNORMAL HIGH (ref 35.0–45.0)
pH, Arterial: 7.289 — ABNORMAL LOW (ref 7.350–7.450)
pH, Arterial: 7.381 (ref 7.350–7.450)
pH, Arterial: 7.457 — ABNORMAL HIGH (ref 7.350–7.450)
pO2, Arterial: 95.4 mmHg (ref 80.0–100.0)

## 2011-02-17 LAB — CULTURE, BLOOD (ROUTINE X 2)
Culture: NO GROWTH
Culture: NO GROWTH

## 2011-02-17 LAB — URINALYSIS, ROUTINE W REFLEX MICROSCOPIC
Bilirubin Urine: NEGATIVE
Specific Gravity, Urine: 1.02 (ref 1.005–1.030)
pH: 7 (ref 5.0–8.0)

## 2011-02-17 LAB — VANCOMYCIN, TROUGH
Vancomycin Tr: 14.4 ug/mL (ref 10.0–20.0)
Vancomycin Tr: 20.7 ug/mL — ABNORMAL HIGH (ref 10.0–20.0)
Vancomycin Tr: 7.6 ug/mL — ABNORMAL LOW (ref 10.0–20.0)

## 2011-02-17 LAB — URINE CULTURE

## 2011-02-17 LAB — SODIUM
Sodium: 141 mEq/L (ref 135–145)
Sodium: 141 mEq/L (ref 135–145)
Sodium: 142 mEq/L (ref 135–145)
Sodium: 144 mEq/L (ref 135–145)
Sodium: 144 mEq/L (ref 135–145)
Sodium: 145 mEq/L (ref 135–145)
Sodium: 147 mEq/L — ABNORMAL HIGH (ref 135–145)
Sodium: 148 mEq/L — ABNORMAL HIGH (ref 135–145)
Sodium: 149 mEq/L — ABNORMAL HIGH (ref 135–145)
Sodium: 150 mEq/L — ABNORMAL HIGH (ref 135–145)
Sodium: 151 mEq/L — ABNORMAL HIGH (ref 135–145)
Sodium: 152 mEq/L — ABNORMAL HIGH (ref 135–145)
Sodium: 153 mEq/L — ABNORMAL HIGH (ref 135–145)
Sodium: 153 mEq/L — ABNORMAL HIGH (ref 135–145)
Sodium: 154 mEq/L — ABNORMAL HIGH (ref 135–145)

## 2011-02-17 LAB — TRIGLYCERIDES: Triglycerides: 141 mg/dL (ref <150)

## 2011-02-17 LAB — URINALYSIS, MICROSCOPIC ONLY
Glucose, UA: NEGATIVE mg/dL
Leukocytes, UA: NEGATIVE
Nitrite: NEGATIVE
pH: 7 (ref 5.0–8.0)

## 2011-02-17 LAB — CATH TIP CULTURE

## 2011-02-17 LAB — LEGIONELLA ANTIGEN, URINE: Legionella Antigen, Urine: NEGATIVE

## 2011-02-17 LAB — PHOSPHORUS
Phosphorus: 2.8 mg/dL (ref 2.3–4.6)
Phosphorus: 3.1 mg/dL (ref 2.3–4.6)

## 2011-02-17 LAB — CULTURE, BAL-QUANTITATIVE W GRAM STAIN: Colony Count: 2000

## 2011-02-17 LAB — COMPREHENSIVE METABOLIC PANEL
ALT: 27 U/L (ref 0–53)
Albumin: 2.2 g/dL — ABNORMAL LOW (ref 3.5–5.2)
Alkaline Phosphatase: 32 U/L — ABNORMAL LOW (ref 39–117)
Potassium: 3.5 mEq/L (ref 3.5–5.1)
Sodium: 146 mEq/L — ABNORMAL HIGH (ref 135–145)
Total Protein: 4.7 g/dL — ABNORMAL LOW (ref 6.0–8.3)

## 2011-02-17 LAB — POCT I-STAT 3, ART BLOOD GAS (G3+)
TCO2: 25 mmol/L (ref 0–100)
pCO2 arterial: 36.9 mmHg (ref 35.0–45.0)
pH, Arterial: 7.427 (ref 7.350–7.450)

## 2011-02-17 LAB — STREP PNEUMONIAE URINARY ANTIGEN: Strep Pneumo Urinary Antigen: NEGATIVE

## 2011-02-17 LAB — APTT
aPTT: 27 seconds (ref 24–37)
aPTT: 29 seconds (ref 24–37)

## 2011-02-17 LAB — PHENYTOIN LEVEL, FREE AND TOTAL: Phenytoin, Total: <2.5 mg/L (ref 10.0–20.0)

## 2011-02-17 LAB — POCT HEMOGLOBIN-HEMACUE: Hemoglobin: 16.7 g/dL (ref 13.0–17.0)

## 2011-02-17 LAB — URINE MICROSCOPIC-ADD ON

## 2011-02-17 LAB — PROTIME-INR: INR: 1.12 (ref 0.00–1.49)

## 2011-02-17 LAB — MAGNESIUM: Magnesium: 2.1 mg/dL (ref 1.5–2.5)

## 2011-02-18 ENCOUNTER — Ambulatory Visit: Payer: PRIVATE HEALTH INSURANCE | Admitting: Rehabilitative and Restorative Service Providers"

## 2011-02-18 LAB — BASIC METABOLIC PANEL
BUN: 9 mg/dL (ref 6–23)
CO2: 27 mEq/L (ref 19–32)
Calcium: 7.6 mg/dL — ABNORMAL LOW (ref 8.4–10.5)
Chloride: 104 mEq/L (ref 96–112)
Chloride: 106 mEq/L (ref 96–112)
Creatinine, Ser: 0.64 mg/dL (ref 0.4–1.5)
GFR calc Af Amer: >60 mL/min (ref >60)
Glucose, Bld: 91 mg/dL (ref 70–99)
Potassium: 3.5 mEq/L (ref 3.5–5.1)
Sodium: 139 mEq/L (ref 135–145)
Sodium: 137 mEq/L (ref 135–145)

## 2011-02-18 LAB — TYPE AND SCREEN: ABO/RH(D): AB POS

## 2011-02-18 LAB — SURGICAL PCR SCREEN: MRSA, PCR: NEGATIVE

## 2011-02-18 LAB — CBC
HCT: 46.3 % (ref 39.0–52.0)
Hemoglobin: 16.4 g/dL (ref 13.0–17.0)
MCHC: 35.5 g/dL (ref 30.0–36.0)
RBC: 4.82 MIL/uL (ref 4.22–5.81)
RDW: 12.3 % (ref 11.5–15.5)

## 2011-02-18 NOTE — Letter (Signed)
Summary: [facility]  [facility]   Imported By: Sherian Rein 02/12/2011 15:44:56  _____________________________________________________________________  External Attachment:    Type:   Image     Comment:   External Document

## 2011-02-19 ENCOUNTER — Ambulatory Visit: Payer: PRIVATE HEALTH INSURANCE | Admitting: Physical Therapy

## 2011-02-19 ENCOUNTER — Ambulatory Visit: Payer: PRIVATE HEALTH INSURANCE | Admitting: Rehabilitative and Restorative Service Providers"

## 2011-02-20 LAB — GLUCOSE, CAPILLARY
Glucose-Capillary: 102 mg/dL — ABNORMAL HIGH (ref 70–99)
Glucose-Capillary: 107 mg/dL — ABNORMAL HIGH (ref 70–99)
Glucose-Capillary: 118 mg/dL — ABNORMAL HIGH (ref 70–99)
Glucose-Capillary: 124 mg/dL — ABNORMAL HIGH (ref 70–99)
Glucose-Capillary: 126 mg/dL — ABNORMAL HIGH (ref 70–99)
Glucose-Capillary: 126 mg/dL — ABNORMAL HIGH (ref 70–99)
Glucose-Capillary: 132 mg/dL — ABNORMAL HIGH (ref 70–99)
Glucose-Capillary: 137 mg/dL — ABNORMAL HIGH (ref 70–99)
Glucose-Capillary: 139 mg/dL — ABNORMAL HIGH (ref 70–99)
Glucose-Capillary: 140 mg/dL — ABNORMAL HIGH (ref 70–99)
Glucose-Capillary: 154 mg/dL — ABNORMAL HIGH (ref 70–99)
Glucose-Capillary: 156 mg/dL — ABNORMAL HIGH (ref 70–99)
Glucose-Capillary: 160 mg/dL — ABNORMAL HIGH (ref 70–99)
Glucose-Capillary: 219 mg/dL — ABNORMAL HIGH (ref 70–99)
Glucose-Capillary: 89 mg/dL (ref 70–99)
Glucose-Capillary: 89 mg/dL (ref 70–99)
Glucose-Capillary: 96 mg/dL (ref 70–99)
Glucose-Capillary: 97 mg/dL (ref 70–99)

## 2011-02-20 LAB — BASIC METABOLIC PANEL
BUN: 20 mg/dL (ref 6–23)
BUN: 9 mg/dL (ref 6–23)
CO2: 31 mEq/L (ref 19–32)
Calcium: 8.9 mg/dL (ref 8.4–10.5)
Chloride: 100 mEq/L (ref 96–112)
Creatinine, Ser: 0.6 mg/dL (ref 0.4–1.5)
Creatinine, Ser: 0.73 mg/dL (ref 0.4–1.5)
GFR calc non Af Amer: >60 mL/min (ref >60)
Glucose, Bld: 106 mg/dL — ABNORMAL HIGH (ref 70–99)
Glucose, Bld: 98 mg/dL (ref 70–99)
Potassium: 4.2 mEq/L (ref 3.5–5.1)

## 2011-02-20 LAB — CBC
HCT: 40.9 % (ref 39.0–52.0)
Platelets: 369 10*3/uL (ref 150–400)
RDW: 12.7 % (ref 11.5–15.5)
WBC: 6.4 10*3/uL (ref 4.0–10.5)

## 2011-02-21 ENCOUNTER — Ambulatory Visit: Payer: PRIVATE HEALTH INSURANCE | Admitting: Rehabilitative and Restorative Service Providers"

## 2011-02-24 ENCOUNTER — Ambulatory Visit: Payer: PRIVATE HEALTH INSURANCE | Admitting: Physical Therapy

## 2011-02-24 NOTE — Op Note (Signed)
  [name], Joseph               ACCOUNT NO.:  [id number]  MEDICAL RECORD NO.:  [id number]           PATIENT TYPE:  O  LOCATION:  [facility]                          FACILITY:  [facility]  PHYSICIAN:  Ayomide Purdy C. Vernie Ammons, M.D.  DATE OF BIRTH:  [birth year]  DATE OF PROCEDURE:  02/17/2011 DATE OF DISCHARGE:                              OPERATIVE REPORT   PREOPERATIVE DIAGNOSIS:  Bladder calculus.  POSTOPERATIVE DIAGNOSIS:  Bladder calculus.  PROCEDURE:  Cystolitholapaxy (stone size  3.0cm.)  SURGEON:  Nycole Kawahara C. Vernie Ammons, MD  ANESTHESIA:  General.  DRAINS:  None.  BLOOD LOSS:  Minimal.  SPECIMENS:  Stone given to the patient.  COMPLICATIONS:  None.  INDICATIONS:  The patient is a 50 year old male who has had a stroke in 2011.  He developed urgency and urge incontinence and as part of his workup, a urodynamic study was performed.  The fluoroscopic images of the study revealed what appeared to be a bladder calculus and this was confirmed to be a large bladder stone cystoscopically in my office.  He was brought to the operating room for cystolitholapaxy.  The risks, complications, alternatives, and limitations have been discussed.  He understands and has elected to proceed.  DESCRIPTION OF OPERATION:  After informed consent, the patient was brought to the major OR, placed on the table, administered general anesthesia, then moved to the dorsal lithotomy position.  His genitalia was sterilely prepped and draped and an official time-out was then performed.  The 22-French cystoscope with 12-degree lens was then passed under direct vision down the urethra which was noted to be normal.  There was a slight area of stricturing in the bulbar urethra that easily allowed the scope passage and was felt to be of no clinical significance.  The prostatic urethra revealed rather high bladder neck.  The bladder itself was then entered and noted at 1+ trabeculation.  The large, oval bladder stone was  identified on the floor of the bladder and photographed.  The 1000-micron holmium laser fiber was then passed through the cystoscope and used to fragment the stone completely.  I then used the Microvasive evacuator to remove all of the stone fragments.  I then reinspected the bladder and noted no further stone fragments present and no injury to the bladder wall.  I, therefore, drained the bladder and removed the cystoscope.  The patient was awakened and taken to recovery room in stable and satisfactory condition.  He tolerated the procedure well and there were no intraoperative complications.  He will be given a prescription for Pyridium 200 mg #28 and followup will be with me in the office in 1 week.  Of note during the procedure, with the patient under general anesthesia, I filled his bladder to capacity and found that his bladder capacity was only 300 cc.     Addasyn Mcbreen C. Vernie Ammons, M.D.     MCO/MEDQ  D:  02/17/2011  T:  02/17/2011  Job:  [id number]  Electronically Signed by Ihor Gully M.D. on 02/24/2011 12:19:56 PM

## 2011-02-25 ENCOUNTER — Encounter: Payer: PRIVATE HEALTH INSURANCE | Attending: Physical Medicine & Rehabilitation

## 2011-02-25 ENCOUNTER — Ambulatory Visit (HOSPITAL_BASED_OUTPATIENT_CLINIC_OR_DEPARTMENT_OTHER): Payer: PRIVATE HEALTH INSURANCE | Admitting: Physical Medicine & Rehabilitation

## 2011-02-25 DIAGNOSIS — G8110 Spastic hemiplegia affecting unspecified side: Secondary | ICD-10-CM

## 2011-02-25 DIAGNOSIS — R339 Retention of urine, unspecified: Secondary | ICD-10-CM

## 2011-02-25 DIAGNOSIS — I69959 Hemiplegia and hemiparesis following unspecified cerebrovascular disease affecting unspecified side: Secondary | ICD-10-CM | POA: Insufficient documentation

## 2011-02-25 DIAGNOSIS — Z87442 Personal history of urinary calculi: Secondary | ICD-10-CM | POA: Insufficient documentation

## 2011-02-25 DIAGNOSIS — I69920 Aphasia following unspecified cerebrovascular disease: Secondary | ICD-10-CM

## 2011-02-25 DIAGNOSIS — I6330 Cerebral infarction due to thrombosis of unspecified cerebral artery: Secondary | ICD-10-CM

## 2011-02-26 ENCOUNTER — Ambulatory Visit: Payer: PRIVATE HEALTH INSURANCE | Admitting: Physical Therapy

## 2011-02-27 ENCOUNTER — Ambulatory Visit: Payer: PRIVATE HEALTH INSURANCE | Admitting: Rehabilitative and Restorative Service Providers"

## 2011-03-10 ENCOUNTER — Ambulatory Visit: Payer: PRIVATE HEALTH INSURANCE | Attending: Neurosurgery | Admitting: Physical Therapy

## 2011-03-10 DIAGNOSIS — Z5189 Encounter for other specified aftercare: Secondary | ICD-10-CM | POA: Insufficient documentation

## 2011-03-10 DIAGNOSIS — R269 Unspecified abnormalities of gait and mobility: Secondary | ICD-10-CM | POA: Insufficient documentation

## 2011-03-10 DIAGNOSIS — M6281 Muscle weakness (generalized): Secondary | ICD-10-CM | POA: Insufficient documentation

## 2011-03-10 DIAGNOSIS — R482 Apraxia: Secondary | ICD-10-CM | POA: Insufficient documentation

## 2011-03-10 DIAGNOSIS — I69920 Aphasia following unspecified cerebrovascular disease: Secondary | ICD-10-CM | POA: Insufficient documentation

## 2011-03-10 DIAGNOSIS — I69998 Other sequelae following unspecified cerebrovascular disease: Secondary | ICD-10-CM | POA: Insufficient documentation

## 2011-03-11 ENCOUNTER — Ambulatory Visit: Payer: PRIVATE HEALTH INSURANCE | Admitting: Rehabilitative and Restorative Service Providers"

## 2011-03-17 ENCOUNTER — Ambulatory Visit: Payer: PRIVATE HEALTH INSURANCE | Admitting: Rehabilitative and Restorative Service Providers"

## 2011-03-19 ENCOUNTER — Ambulatory Visit: Payer: PRIVATE HEALTH INSURANCE | Admitting: Rehabilitative and Restorative Service Providers"

## 2011-03-24 ENCOUNTER — Ambulatory Visit: Payer: PRIVATE HEALTH INSURANCE | Admitting: Rehabilitative and Restorative Service Providers"

## 2011-03-26 ENCOUNTER — Ambulatory Visit: Payer: PRIVATE HEALTH INSURANCE | Admitting: Rehabilitative and Restorative Service Providers"

## 2011-03-31 NOTE — Assessment & Plan Note (Signed)
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  Therapy is wrapping up with him.  They are not feeling that he is a candidate for Bioness systems.  Significant other continues to push for these.  He is doing better with his new cane which is essentially a tripod-type cane.  Joseph [name] at outpatient rehab has spoken to family about Administrator, arts program which they are both interested in.  Significant other also mentions hyperbaric therapy today.  The patient denies pain.  He did have a recent large stone removed from his bladder by Nephrology.  REVIEW OF SYSTEMS:  Notable for the above.  Full review is in the written health and history section of the chart.  SOCIAL HISTORY:  Unchanged.  He is single, divorced, and significant other is with him today.  PHYSICAL EXAMINATION:  Blood pressure 124/64, pulse 75, respiratory rate 18, he is sating 95% on room air.  The patient is generally pleasant. He remains aphasic but at extra times he was able to communicate simple ideas and thoughts.  He continues to have significant tone in the right side.  At the pectoralis major and minor, his tone is 2/4.  He is 1+/4 biceps and brachioradialis and 2/4 FDP, FDS, musculature, right lower extremity.  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  He has some mild tone in the tibialis anterior also.  His contracture at the ankle is about 10 degrees.  The patient seems to be alert and appropriate.  He is able to follow commands.  He walks with a cane and his right air splint.  He uses a circumducted gait pattern and hyperextends the knee.  ASSESSMENT: 1. 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 2. History of urinary frequency and decreased flow with recent removal     of large stone from the bladder.  PLAN: 1. We discussed Bioness at length today.  I think before we go forward     with  any thoughts of Bioness we need to work on his tone management     and improve his spasticity throughout the right side.  I do not     believe he is a candidate for Bioness until he has better control     of his tone.  I think he could wrap up therapy for the time-being     and revisit therapy once Botox is performed. 2. I certainly advocate him seeing Joseph [name] augmentation education     program and wrote a prescription as such.  The patient is also     interested in pursuing hyperbaric therapy, and certainly I found no     evidence that this has made substantive benefits for patients in     this scenario but certainly this would not hurt Joseph [name] in anyway.     If family wants to raise funds to pursue hyperbaric therapy, I will     not stand in front of that but I think they should have realistic     expectations of likely response and benefits. 3. I will see the patient back pending scheduling of the above.     Ranelle Oyster, M.D. Electronically Signed    ZTS/MedQ D:  02/25/2011 13:12:00  T:  02/25/2011 23:31:49  Job #:  [id number]  cc:   Stefanie Libel Fax: [phone]

## 2011-04-02 ENCOUNTER — Encounter
Payer: PRIVATE HEALTH INSURANCE | Attending: Physical Medicine & Rehabilitation | Admitting: Physical Medicine & Rehabilitation

## 2011-04-02 ENCOUNTER — Ambulatory Visit: Payer: Self-pay | Admitting: Physical Medicine & Rehabilitation

## 2011-04-02 DIAGNOSIS — G8110 Spastic hemiplegia affecting unspecified side: Secondary | ICD-10-CM

## 2011-04-03 NOTE — Procedures (Signed)
NAMEGABINO, HAGIN               ACCOUNT NO.:  [id number]  MEDICAL RECORD NO.:  [id number]           PATIENT TYPE:  O  LOCATION:  [facility]                          FACILITY:  [facility]  PHYSICIAN:  Ranelle Oyster, M.D.DATE OF BIRTH:  1961-08-29  DATE OF PROCEDURE:  04/02/2011 DATE OF DISCHARGE:                              OPERATIVE REPORT  PROCEDURE:  Botox and phenol injection, diagnostic code 342.11, spastic hemiparesis, dominant side.  DESCRIPTION OF PROCEDURE:  After informed consent, we injected the right upper extremity with Botox targeting the right teres major and latissimus dorsi muscles with 75 units of Botox, 25 units into pectoralis minor, 50 units into the flexor pollicis longus, 150 units into the FDP and FDS, and 100 units into the pronator teres as well as partially into the flexor bed.  Each 100 units of Botox was diluted in 1 mL preservative-free normal saline.  The patient tolerated well.  Additionally, we injected the right tibial nerve at the popliteal fossa with 5 mL of phenol solution.  I utilized electrical stimulation to find this nerve and titrated down to amplitude of less than 1 milliampere. We still were receiving adequate tibial nerve response.  At that point, I aspirated and injected the 5 mL of phenol.  The patient had immediate relaxation of his right ankle and experienced some improvement in his stance and swing phase of his gait.  1. I will send the patient for outpatient PT and OT to work on gait     and spasticity management.  Also, we will refer to [facility] for     hyperbaric oxygen therapy per patient request. 2. I will see the patient back here in about 2 months' time.     Ranelle Oyster, M.D. Electronically Signed    ZTS/MEDQ  D:  04/02/2011 14:39:49  T:  04/03/2011 00:50:08  Job:  [id number]

## 2011-04-18 NOTE — Op Note (Signed)
NAME:  Joseph [name], Joseph [name]                       ACCOUNT NO.:  [id number]   MEDICAL RECORD NO.:  [id number]                   PATIENT TYPE:  AMB   LOCATION:  DAY                                  FACILITY:  [facility]   PHYSICIAN:  Barbaraann Barthel, M.D.              DATE OF BIRTH:  01-Feb-1961   DATE OF PROCEDURE:  09/20/2003  DATE OF DISCHARGE:                                 OPERATIVE REPORT   SURGEON:  Barbaraann Barthel, M.D.   PREOPERATIVE DIAGNOSIS:  Left inguinal hernia.   POSTOPERATIVE DIAGNOSIS:  Left inguinal hernia.   PROCEDURE:  Left inguinal herniorrhaphy.   SPECIMENS:  Left inguinal hernia sac.   INDICATIONS FOR PROCEDURE:  This is a 50 year old male who was sent to me by  Dr. Wende Crease with a left inguinal hernia.  It was nonincarcerated.  We  discussed elective repair and discussed the complications not limited to but  including bleeding, infection, and recurrence.  Informed consent was  obtained.   GROSS OPERATIVE FINDINGS:  Those consistent with an indirect hernia.  The  patient had considerable lipomas of the spermatic cord which but no other  abnormalities.   TECHNIQUE:  The patient was placed in the supine position.  After the  adequate administration of general anesthesia via LMA anesthesia, the  patient's abdomen was prepped with Betadine solution and draped in the usual  manner.  Following aseptic technique, a Foley catheter was placed.   An incision was carried out between the anterior superior iliac spine and  the pubic tubercle through the skin and subcutaneous tissue, opening the  external oblique through the external ring.  The cord structures were then  dissected free from the hernia sac which was ligated, the redundant portion  of which was amputated and sent as a specimen.  This was ligated with 2-0  Bralon.  The hernia floor was then approximated, closing the fascia,  transversalis, and transversus abdominis to Cooper's ligament and Poupart's  ligament  with interrupted 2-0 Bralon sutures.  The cord structures were  returned to their anatomic position, and the external oblique was then  approximated over the cord structures using a 3-0 Polysorb suture.  The  subcutaneous tissue was irrigated.  I used approximately 4 or 5 cc of 0.5%  Sensorcaine to anesthetize the area to add to postoperative comfort and the  closed the skin with a stapling device.   Prior to closure, all sponge, needle, and instrument counts were found to be  correct.  Estimated blood loss was minimal.  There were no complications.      ___________________________________________                                            Barbaraann Barthel, M.D.   WB/MEDQ  D:  09/20/2003  T:  09/20/2003  Job:  [id number]   cc:   Laverle Hobby, M.D.

## 2011-05-12 ENCOUNTER — Ambulatory Visit: Payer: PRIVATE HEALTH INSURANCE | Attending: Neurosurgery | Admitting: Rehabilitative and Restorative Service Providers"

## 2011-05-12 ENCOUNTER — Ambulatory Visit: Payer: PRIVATE HEALTH INSURANCE | Admitting: Occupational Therapy

## 2011-05-12 DIAGNOSIS — I69920 Aphasia following unspecified cerebrovascular disease: Secondary | ICD-10-CM | POA: Insufficient documentation

## 2011-05-12 DIAGNOSIS — M6281 Muscle weakness (generalized): Secondary | ICD-10-CM | POA: Insufficient documentation

## 2011-05-12 DIAGNOSIS — R269 Unspecified abnormalities of gait and mobility: Secondary | ICD-10-CM | POA: Insufficient documentation

## 2011-05-12 DIAGNOSIS — Z5189 Encounter for other specified aftercare: Secondary | ICD-10-CM | POA: Insufficient documentation

## 2011-05-12 DIAGNOSIS — I69998 Other sequelae following unspecified cerebrovascular disease: Secondary | ICD-10-CM | POA: Insufficient documentation

## 2011-05-12 DIAGNOSIS — R482 Apraxia: Secondary | ICD-10-CM | POA: Insufficient documentation

## 2011-05-14 ENCOUNTER — Ambulatory Visit: Payer: PRIVATE HEALTH INSURANCE | Admitting: Rehabilitative and Restorative Service Providers"

## 2011-05-14 ENCOUNTER — Ambulatory Visit: Payer: PRIVATE HEALTH INSURANCE | Admitting: Occupational Therapy

## 2011-05-19 ENCOUNTER — Ambulatory Visit: Payer: PRIVATE HEALTH INSURANCE | Admitting: Rehabilitative and Restorative Service Providers"

## 2011-05-19 ENCOUNTER — Ambulatory Visit: Payer: PRIVATE HEALTH INSURANCE | Admitting: Occupational Therapy

## 2011-05-21 ENCOUNTER — Ambulatory Visit: Payer: PRIVATE HEALTH INSURANCE | Admitting: Occupational Therapy

## 2011-05-21 ENCOUNTER — Ambulatory Visit: Payer: PRIVATE HEALTH INSURANCE | Admitting: Rehabilitative and Restorative Service Providers"

## 2011-05-27 ENCOUNTER — Ambulatory Visit: Payer: PRIVATE HEALTH INSURANCE | Admitting: Rehabilitative and Restorative Service Providers"

## 2011-05-28 ENCOUNTER — Encounter: Payer: PRIVATE HEALTH INSURANCE | Admitting: Occupational Therapy

## 2011-05-29 ENCOUNTER — Ambulatory Visit: Payer: PRIVATE HEALTH INSURANCE | Admitting: Occupational Therapy

## 2011-05-29 ENCOUNTER — Ambulatory Visit: Payer: PRIVATE HEALTH INSURANCE | Admitting: Rehabilitative and Restorative Service Providers"

## 2011-06-03 ENCOUNTER — Ambulatory Visit: Payer: PRIVATE HEALTH INSURANCE | Admitting: Physical Therapy

## 2011-06-03 ENCOUNTER — Encounter: Payer: PRIVATE HEALTH INSURANCE | Admitting: Occupational Therapy

## 2011-06-05 ENCOUNTER — Encounter: Payer: PRIVATE HEALTH INSURANCE | Admitting: Occupational Therapy

## 2011-06-05 ENCOUNTER — Ambulatory Visit: Payer: PRIVATE HEALTH INSURANCE | Admitting: Physical Therapy

## 2011-06-09 ENCOUNTER — Encounter
Payer: PRIVATE HEALTH INSURANCE | Attending: Physical Medicine & Rehabilitation | Admitting: Physical Medicine & Rehabilitation

## 2011-06-09 ENCOUNTER — Ambulatory Visit: Payer: PRIVATE HEALTH INSURANCE | Admitting: Rehabilitative and Restorative Service Providers"

## 2011-06-09 ENCOUNTER — Encounter: Payer: PRIVATE HEALTH INSURANCE | Admitting: Occupational Therapy

## 2011-06-09 DIAGNOSIS — R339 Retention of urine, unspecified: Secondary | ICD-10-CM

## 2011-06-09 DIAGNOSIS — I69920 Aphasia following unspecified cerebrovascular disease: Secondary | ICD-10-CM

## 2011-06-09 DIAGNOSIS — I6330 Cerebral infarction due to thrombosis of unspecified cerebral artery: Secondary | ICD-10-CM

## 2011-06-09 DIAGNOSIS — I69959 Hemiplegia and hemiparesis following unspecified cerebrovascular disease affecting unspecified side: Secondary | ICD-10-CM | POA: Insufficient documentation

## 2011-06-09 DIAGNOSIS — G8110 Spastic hemiplegia affecting unspecified side: Secondary | ICD-10-CM

## 2011-06-09 DIAGNOSIS — R350 Frequency of micturition: Secondary | ICD-10-CM | POA: Insufficient documentation

## 2011-06-09 NOTE — Assessment & Plan Note (Signed)
Joseph [name] is back regarding his spastic right hemiparesis.  We injected Botox and phenol on his right side.  I injected around the right tibial nerve at the popliteal fossa and injected right FPL, FDP, FDS, pronator teres, and pectoralis minor and latissimus dorsi of the right teres major muscles at last visit.  He has had improvement in the right upper extremities, right leg.  He is working with PT, although there was significant delay after the injections to getting into therapy.  His significant other is not happy with OT at this point.  He seems to be making connection with his physical therapist.  He had asked for Bioness trials being offered at [facility] and registered and taking part in the study.  He is still walking with his regular TEN issues with no adaptive device.  He does not have his ear splint on today.  The knee tends to hyperextend a lot to his stance.  His therapy is working on better hip and knee control in general.  REVIEW OF SYSTEMS:  Notable for the above.  Full 12-point review is in the written health and history section of the chart.  SOCIAL HISTORY:  The patient is single.  His significant other is with him today.  PHYSICAL EXAMINATION:  VITAL SIGNS:  Blood pressure 101/55, pulse is 76, respiratory rate is 16, he is satting 95% on room air. GENERAL:  The patient is pleasant and alert.  He is doing better with his word findings.  He is still quite disjointed, but using extra time and paraphrasing is often able to get his thoughts across.  He walks with weightbearing. MUSCULOSKELETAL:  He tends to hyperextend the right knee and the right ankle tends to invert and fallen to equinovarus type of position.  I am able to easily flex him back to neutral with some resistance.  Grade 2, tone there are 2/4.  He had improving knee strength and hip strength which are 2-3/5.  Ankle dorsiflexion is trace to 1/5.  Right upper extremity tone is 1-2/4 throughout, and improved.  He  does have some hand grip at 1+ out of 5.  Wrist extension, flexion is trace to one, elbow flexion is trace, elbow extension is one, shoulder movement is more with adduction, is 2/5.  ASSESSMENT: 1. 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 2. History of urinary frequency.  PLAN: 1. We will set patient up for further injections including tibial     nerve phenol block as well as right upper extremity injections     including the same muscles we addressed at last visit, particularly     the wrist flexors, pectoralis major/minor, teres major and     latissimus dorsi muscles. 2. I wrote a referral for Bioness trial at [facility].  He can certainly     give this a shot, although I am not sure that he will be a     functional ambulator with this given his tone and control issues at     the ankle and knee. 3. Continue with home exercises for speech and language needs.  He is     making good stride here. 4. PT will work with him on further gait stability and tone control. 5. We will give patient Norco for breakthrough pain 5/325 one q.12     hours p.r.n.  He may use limited amount of ibuprofen up to 600-800     mg daily.  We will also encourage to use some Tylenol as able.  He     also can try heat and ice as well.     Ranelle Oyster, M.D. Electronically Signed   ZTS/MedQ D:  06/09/2011 13:29:59  T:  06/09/2011 22:47:10  Job #:  [id number]

## 2011-06-11 ENCOUNTER — Ambulatory Visit
Payer: PRIVATE HEALTH INSURANCE | Attending: Physical Medicine & Rehabilitation | Admitting: Rehabilitative and Restorative Service Providers"

## 2011-06-11 ENCOUNTER — Ambulatory Visit: Payer: PRIVATE HEALTH INSURANCE | Admitting: Occupational Therapy

## 2011-06-11 DIAGNOSIS — R269 Unspecified abnormalities of gait and mobility: Secondary | ICD-10-CM | POA: Insufficient documentation

## 2011-06-11 DIAGNOSIS — I69998 Other sequelae following unspecified cerebrovascular disease: Secondary | ICD-10-CM | POA: Insufficient documentation

## 2011-06-11 DIAGNOSIS — Z5189 Encounter for other specified aftercare: Secondary | ICD-10-CM | POA: Insufficient documentation

## 2011-06-11 DIAGNOSIS — M629 Disorder of muscle, unspecified: Secondary | ICD-10-CM | POA: Insufficient documentation

## 2011-06-11 DIAGNOSIS — M2420 Disorder of ligament, unspecified site: Secondary | ICD-10-CM | POA: Insufficient documentation

## 2011-06-11 DIAGNOSIS — M2560 Stiffness of unspecified joint, not elsewhere classified: Secondary | ICD-10-CM | POA: Insufficient documentation

## 2011-06-11 DIAGNOSIS — M6281 Muscle weakness (generalized): Secondary | ICD-10-CM | POA: Insufficient documentation

## 2011-06-12 ENCOUNTER — Ambulatory Visit: Payer: PRIVATE HEALTH INSURANCE | Admitting: Rehabilitative and Restorative Service Providers"

## 2011-06-19 ENCOUNTER — Telehealth: Payer: Self-pay | Admitting: *Deleted

## 2011-06-19 NOTE — Telephone Encounter (Signed)
Pt left vm req rxs for Ambien and Cymbalta. His last OV was 12/2010. I called pt to advise he needs f/u OV. No answer. Left detailed mess informing pt of this.

## 2011-06-24 ENCOUNTER — Ambulatory Visit: Payer: PRIVATE HEALTH INSURANCE | Admitting: Rehabilitative and Restorative Service Providers"

## 2011-06-24 ENCOUNTER — Encounter
Payer: PRIVATE HEALTH INSURANCE | Attending: Physical Medicine & Rehabilitation | Admitting: Physical Medicine & Rehabilitation

## 2011-06-24 DIAGNOSIS — I69959 Hemiplegia and hemiparesis following unspecified cerebrovascular disease affecting unspecified side: Secondary | ICD-10-CM | POA: Insufficient documentation

## 2011-06-25 NOTE — Procedures (Signed)
NAMESIMUEL, STEBNER NO.:  [id number]  MEDICAL RECORD NO.:  [id number]           PATIENT TYPE:  LOCATION:                                 FACILITY:  PHYSICIAN:  Ranelle Oyster, M.D.DATE OF BIRTH:  26-Jul-1961  DATE OF PROCEDURE:  06/24/2011 DATE OF DISCHARGE:                              OPERATIVE REPORT  PROCEDURE:  Botox injection and phenol injection.  DIAGNOSTIC CODE:  342.11 spastic hemiparesis dominant limb.  DESCRIPTION OF PROCEDURE:  I cleansed the skin with isopropyl alcohol, injected around the right tibial nerve at the popliteal fossa using 5 mL of phenol solution.  We titrated down to less than 1 mA stimulation with adequate tibial nerve response.  The patient tolerated well.  We also injected the right pectoralis major, 50 units pectoralis minor, 50 units isthmus dorsi, 25 units biceps brachia, 75 units and the flexor digitorum profundus and superficialis with 100 units.  Additionally, we approached the lumbricals and injected 25 units into each lumbrical of the right hand.  Each 100 units of Botox was diluted with 1 mL preservative-free normal saline.  The patient tolerated well with only minimal bleeding noted.  All wounds were cleaned and dressed.  He was given post injection instructions.  I did send him for occupational therapy to work on the hand and upper extremity.  He will followup with PT as well as orthotics regarding bracing and ambulation.  I will see him back here in about 2 months.     Ranelle Oyster, M.D. Electronically Signed    ZTS/MEDQ  D:  06/24/2011 10:37:11  T:  06/24/2011 12:15:50  Job:  [id number]

## 2011-06-26 ENCOUNTER — Ambulatory Visit: Payer: PRIVATE HEALTH INSURANCE | Admitting: Rehabilitative and Restorative Service Providers"

## 2011-06-27 ENCOUNTER — Encounter: Payer: Self-pay | Admitting: Internal Medicine

## 2011-06-30 ENCOUNTER — Encounter: Payer: Self-pay | Admitting: Internal Medicine

## 2011-06-30 ENCOUNTER — Ambulatory Visit (INDEPENDENT_AMBULATORY_CARE_PROVIDER_SITE_OTHER): Payer: PRIVATE HEALTH INSURANCE | Admitting: Internal Medicine

## 2011-06-30 ENCOUNTER — Other Ambulatory Visit (INDEPENDENT_AMBULATORY_CARE_PROVIDER_SITE_OTHER): Payer: PRIVATE HEALTH INSURANCE

## 2011-06-30 VITALS — BP 114/70 | HR 71 | Temp 98.4°F | Resp 16 | Wt 248.0 lb

## 2011-06-30 DIAGNOSIS — I6350 Cerebral infarction due to unspecified occlusion or stenosis of unspecified cerebral artery: Secondary | ICD-10-CM

## 2011-06-30 DIAGNOSIS — R5383 Other fatigue: Secondary | ICD-10-CM | POA: Insufficient documentation

## 2011-06-30 DIAGNOSIS — R319 Hematuria, unspecified: Secondary | ICD-10-CM

## 2011-06-30 DIAGNOSIS — E781 Pure hyperglyceridemia: Secondary | ICD-10-CM

## 2011-06-30 DIAGNOSIS — G8110 Spastic hemiplegia affecting unspecified side: Secondary | ICD-10-CM

## 2011-06-30 DIAGNOSIS — R5381 Other malaise: Secondary | ICD-10-CM

## 2011-06-30 LAB — URINALYSIS, ROUTINE W REFLEX MICROSCOPIC
Ketones, ur: NEGATIVE
Urine Glucose: NEGATIVE
Urobilinogen, UA: 0.2 (ref 0.0–1.0)

## 2011-06-30 LAB — CBC WITH DIFFERENTIAL/PLATELET
Basophils Absolute: 0 10*3/uL (ref 0.0–0.1)
Basophils Relative: 0.6 % (ref 0.0–3.0)
Eosinophils Absolute: 0.1 10*3/uL (ref 0.0–0.7)
MCHC: 32.2 g/dL (ref 30.0–36.0)
MCV: 97.1 fl (ref 78.0–100.0)
Monocytes Absolute: 0.5 10*3/uL (ref 0.1–1.0)
Neutrophils Relative %: 64.6 % (ref 43.0–77.0)
Platelets: 215 10*3/uL (ref 150.0–400.0)
RDW: 12.8 % (ref 11.5–14.6)

## 2011-06-30 LAB — LIPID PANEL
Cholesterol: 136 mg/dL (ref 0–200)
LDL Cholesterol: 70 mg/dL (ref 0–99)
VLDL: 31.2 mg/dL (ref 0.0–40.0)

## 2011-06-30 LAB — TSH: TSH: 0.82 u[IU]/mL (ref 0.35–5.50)

## 2011-06-30 NOTE — Assessment & Plan Note (Signed)
Labs ordered to look for secondary causes though I think his CVA is explanation enough for fatigue

## 2011-06-30 NOTE — Assessment & Plan Note (Signed)
I will recheck his urine today

## 2011-06-30 NOTE — Patient Instructions (Signed)
Stroke (Cerebrovascular Accident) A stroke is the sudden death of brain tissue. It is a medical emergency. A stroke can cause permanent loss of brain function. If the symptoms of a stroke end without complications in 24 hours, it is diagnosed as a transient ischemic attack (TIA). If the symptoms are not resolved within 24 hours, it is defined as a stroke. CAUSES A stroke is caused by a decrease of oxygen supply to an area of your brain. It is usually the result of a small blood clot or the arteries hardening. A stroke can also be caused by blocked or damaged carotid arteries. Bleeding in the brain can cause, or accompany, a stroke. RISK FACTORS  High blood pressure (hypertension).  High cholesterol.   Diabetes.   Heart disease.   The buildup of fatty deposits in the blood vessels (peripheral artery disease or atherosclerosis).   An abnormal heart rhythm (atrial fibrillation).   Obesity.   Smoking.   Taking oral contraceptives (especially in combination with smoking).   Physical inactivity.   A diet high in fats, salt (sodium), and calories.  Alcohol use.   Use of illegal drugs (especially cocaine and methamphetamine).   Being a male.   Being an African American.   Age over 55.   Family history of stroke.   Previous history of blood clots, a "warning stroke" (transient ischemic attack, TIA), or heart attack.   Sickle cell disease.   SYMPTOMS These symptoms usually develop suddenly (or may be newly present upon awakening from sleep):  Sudden weakness or numbness of the face, arm, or leg, especially on one side of the body.  Sudden confusion.   Trouble speaking (aphasia) or understanding.   Sudden trouble seeing in one or both eyes.   Sudden trouble walking.  Dizziness.   Loss of balance or coordination.   Sudden severe headache with no known cause.   DIAGNOSIS Your caregiver can often determine the presence or absence of a stroke based on your symptoms,  history, and examination. A computerized X-ray scan (CT or CAT scan) of the brain is usually performed to confirm the stroke, to look for causes, and to determine the severity. Other tests may be done to find the cause of the stroke. These tests may include:  An EKG and heart monitoring.   An ultrasound evaluation of the heart (echocardiogram).   An ultrasound evaluation of your carotid arteries.   A computerized magnetic scan (MRI).   A scan of the brain circulation.   Blood oxygen level monitoring.   Blood tests.  PREVENTION The risk of a stroke can be decreased by appropriately treating high blood pressure, high cholesterol, diabetes, heart disease, and obesity and by quitting smoking, limiting alcohol, and staying physically active. TREATMENT TIME IS OF THE ESSENCE! Medicines to dissolve a blood clot can only be used within 4 1/2 hours of the onset of symptoms. After the 4 1/2 hour window has passed, treatment may include rest, oxygen, intravenous (IV) fluids, and medicines to thin the blood (to prevent another stroke). Treatment of stroke depends on the duration, severity, and cause of your symptoms. Medicines and diet may be used to address diabetes, high blood pressure, and other risk factors. Physical, speech, and occupational therapists will assess you and work to improve any functions impaired by the stroke. Measures will be taken to prevent short-term and long-term complications, including infection from breathing foreign material into the lungs (aspiration pneumonia), blood clots in the legs, bedsores, and falls. Rarely, surgery may   be needed to remove large blood clots or to open up blocked arteries. HOME CARE INSTRUCTIONS  Medicines: Aspirin and blood thinners may be used to prevent another stroke. Blood thinners need to be used exactly as instructed. Medicines may also be used to control risk factors for a stroke. Be sure you understand all your medicine instructions.   Diet:  Certain diets may be prescribed to address high blood pressure, high cholesterol, diabetes, or obesity.   A low salt (sodium), low saturated fat, low trans fat, low cholesterol diet is recommended to manage high blood pressure.   A low saturated fat, low trans fat, low cholesterol, and high fiber diet may control cholesterol levels.   A controlled carbohydrate, controlled sugar diet is recommended to manage diabetes.   A reduced calorie, low sodium, low saturated fat, low trans fat, low cholesterol diet is recommended to manage obesity.  A diet that includes 5 or more servings of fruits and vegetables a day may reduce the risk of stroke. Foods  may need to be a special consistency (soft or pureed), or small bites may need to be taken in order to avoid  aspirating or choking.  Maintain a healthy weight.   Stay physically active. It is recommended that you get at least 30 minutes of activity on most or all days.   Do not smoke.   Limit alcohol use. Moderate alcohol use is considered to be:   No more than 2 drinks per day for men.   No more than 1 drink per day for nonpregnant women.   Stop drug abuse.   Home safety: A safe home environment is important to reduce the risk of falls. Your caregiver may arrange for specialists to evaluate your home. Having grab bars in the bedroom and bathroom is often important. Your caregiver may arrange for special equipment to be used at home, such as raised toilets and a seat for the shower.   Physical, occupational, and speech therapy: Ongoing therapy may be needed to maximize your recovery after a stroke. If you have been advised to use a walker or a cane, use it at all times. Be sure to keep your therapy appointments.   Follow all instructions for follow-up with your caregiver. This is VERY important. This includes any referrals, physical therapy, rehabilitation, and laboratory tests. Proper treatment also prevents another stroke from occurring.    SEEK IMMEDIATE MEDICAL CARE IF:  You have sudden weakness or numbness of the face, arm, or leg, especially on one side of the body.   You have sudden confusion.   You have trouble speaking (aphasia) or understanding.   You have sudden trouble seeing in one or both eyes.   You have sudden trouble walking.   You have dizziness.   You have a loss of balance or coordination.   You have a sudden severe headache with no known cause.   You have an oral temperature above 100.5, not controlled by medicine.   You are coughing or have difficulty breathing.   You have new chest pain, angina, or an irregular heartbeat.  ANY OF THESE SYMPTOMS MAY REPRESENT A SERIOUS PROBLEM THAT IS AN EMERGENCY. Do not wait to see if the symptoms will go away. Get medical help right away. Call   (911 in [place] DO NOT drive yourself to the hospital. Document Released: 11/17/2005 Document Re-Released: 05/07/2010 [employer] Patient Information 2011 [employer]. 

## 2011-06-30 NOTE — Assessment & Plan Note (Signed)
I will check his FLP today 

## 2011-06-30 NOTE — Progress Notes (Signed)
Subjective:    Patient ID: Joseph [name], male    DOB: 1961-03-18, 50 y.o.   MRN: [id number]  HPI He returns for f/up and is improving, he wants to apply for a driver's license. The strength in his right arm and leg is improving and he is actively doing PT and rehab.. He has had some fatigue and wants to have his testosterone level tested.    Review of Systems  Constitutional: Positive for fatigue. Negative for fever, chills, diaphoresis, activity change, appetite change and unexpected weight change.  HENT: Negative for facial swelling, trouble swallowing, neck pain, neck stiffness and voice change.   Eyes: Negative for photophobia, redness and visual disturbance.  Respiratory: Negative for apnea, cough, choking, chest tightness, shortness of breath, wheezing and stridor.   Cardiovascular: Negative for chest pain, palpitations and leg swelling.  Gastrointestinal: Positive for constipation. Negative for nausea, vomiting, abdominal pain and diarrhea.  Genitourinary: Negative for dysuria, urgency, frequency, hematuria, flank pain, decreased urine volume, discharge, penile swelling, scrotal swelling, enuresis, difficulty urinating, genital sores, penile pain and testicular pain.  Musculoskeletal: Negative for myalgias, back pain, joint swelling, arthralgias and gait problem.  Skin: Negative for color change, pallor, rash and wound.  Neurological: Positive for weakness (RUE and RLE - chronic and unchanged) and numbness (RU and RLE - chronic and unchanged). Negative for dizziness, tremors, seizures, syncope, facial asymmetry, speech difficulty, light-headedness and headaches.  Hematological: Negative for adenopathy. Does not bruise/bleed easily.  Psychiatric/Behavioral: Negative.        Objective:   Physical Exam  Vitals reviewed. Constitutional: He is oriented to person, place, and time. He appears well-developed and well-nourished. No distress.  HENT:  Head: Normocephalic and atraumatic.    Right Ear: External ear normal.  Left Ear: External ear normal.  Nose: Nose normal.  Mouth/Throat: Oropharynx is clear and moist. No oropharyngeal exudate.  Eyes: Conjunctivae and EOM are normal. Pupils are equal, round, and reactive to light. Right eye exhibits no discharge. Left eye exhibits no discharge. No scleral icterus.  Neck: Normal range of motion. Neck supple. No JVD present. No tracheal deviation present. No thyromegaly present.  Cardiovascular: Normal rate, regular rhythm, normal heart sounds and intact distal pulses.  Exam reveals no gallop and no friction rub.   No murmur heard. Pulmonary/Chest: Effort normal and breath sounds normal. No stridor. No respiratory distress. He has no wheezes. He has no rales. He exhibits no tenderness.  Abdominal: Soft. Bowel sounds are normal. He exhibits no distension and no mass. There is no tenderness. There is no rebound and no guarding.  Musculoskeletal: Normal range of motion. He exhibits no edema and no tenderness.  Lymphadenopathy:    He has no cervical adenopathy.  Neurological: He is alert and oriented to person, place, and time. He displays atrophy (RUE and RLE) and abnormal reflex. He displays no tremor. No cranial nerve deficit. He exhibits abnormal muscle tone (RUE and RLE). He displays a negative Romberg sign. He displays no seizure activity. Coordination and gait abnormal. He displays no Babinski's sign on the left side.  Reflex Scores:      Tricep reflexes are 2+ on the right side and 1+ on the left side.      Bicep reflexes are 2+ on the right side and 1+ on the left side.      Brachioradialis reflexes are 2+ on the right side and 1+ on the left side.      Patellar reflexes are 2+ on the right side and  1+ on the left side.      Achilles reflexes are 2+ on the right side. Skin: Skin is warm and dry. No rash noted. He is not diaphoretic. No erythema. No pallor.  Psychiatric: He has a normal mood and affect. His behavior is normal.  Judgment and thought content normal.      Lab Results  Component Value Date   WBC 6.6 12/30/2010   HGB 16.7 02/17/2011   HCT 48.0 12/30/2010   PLT 241.0 12/30/2010   CHOL 155 12/30/2010   TRIG 386.0* 12/30/2010   HDL 29.80* 12/30/2010   LDLDIRECT 91.1 12/30/2010   ALT 19 12/30/2010   AST 19 12/30/2010   NA 138 12/30/2010   K 4.0 12/30/2010   CL 100 12/30/2010   CREATININE 0.9 12/30/2010   BUN 11 12/30/2010   CO2 30 12/30/2010   TSH 0.63 12/30/2010   INR 1.15 12/21/2009   HGBA1C  Value: 5.4 (NOTE) The ADA recommends the following therapeutic goal for glycemic control related to Hgb A1c measurement: Goal of therapy: <6.5 Hgb A1c  Reference: American Diabetes Association: Clinical Practice Recommendations 2010, Diabetes Care, 2010, 33: (Suppl  1). 01/13/2010      Assessment & Plan:   No problem-specific assessment & plan notes found for this encounter.

## 2011-06-30 NOTE — Assessment & Plan Note (Signed)
Improvements noted, I wrote a letter on his behalf for the [facility]

## 2011-07-01 ENCOUNTER — Encounter: Payer: Self-pay | Admitting: Internal Medicine

## 2011-07-01 ENCOUNTER — Telehealth: Payer: Self-pay

## 2011-07-01 LAB — TESTOSTERONE, FREE, TOTAL, SHBG: Testosterone: 279.6 ng/dL (ref 250–890)

## 2011-07-01 MED ORDER — DULOXETINE HCL 30 MG PO CPEP: 30.0000 mg | ORAL_CAPSULE | Freq: Every day | ORAL | Status: DC

## 2011-07-01 MED ORDER — TRAMADOL HCL 50 MG PO TABS: 50.0000 mg | ORAL_TABLET | Freq: Three times a day (TID) | ORAL | Status: DC | PRN

## 2011-07-01 MED ORDER — ZOLPIDEM TARTRATE 10 MG PO TABS: 10.0000 mg | ORAL_TABLET | Freq: Every evening | ORAL | Status: DC | PRN

## 2011-07-01 MED ORDER — PANTOPRAZOLE SODIUM 40 MG PO TBEC: 40.0000 mg | DELAYED_RELEASE_TABLET | Freq: Every day | ORAL | Status: DC

## 2011-07-01 NOTE — Telephone Encounter (Signed)
Wife called stating that pt was seen mon 06/30/11 and need rx refills sent to pharmacy. Patient wife notified and rx called to  pharmacy

## 2011-07-07 ENCOUNTER — Encounter: Payer: PRIVATE HEALTH INSURANCE | Admitting: Occupational Therapy

## 2011-07-09 ENCOUNTER — Encounter: Payer: PRIVATE HEALTH INSURANCE | Admitting: Occupational Therapy

## 2011-07-09 ENCOUNTER — Ambulatory Visit: Payer: PRIVATE HEALTH INSURANCE | Admitting: Rehabilitative and Restorative Service Providers"

## 2011-07-11 ENCOUNTER — Ambulatory Visit: Payer: PRIVATE HEALTH INSURANCE | Admitting: Rehabilitative and Restorative Service Providers"

## 2011-07-14 ENCOUNTER — Encounter: Payer: PRIVATE HEALTH INSURANCE | Admitting: Occupational Therapy

## 2011-07-15 ENCOUNTER — Encounter: Payer: PRIVATE HEALTH INSURANCE | Admitting: Physical Medicine & Rehabilitation

## 2011-07-16 ENCOUNTER — Encounter: Payer: PRIVATE HEALTH INSURANCE | Admitting: Occupational Therapy

## 2011-07-21 ENCOUNTER — Encounter: Payer: PRIVATE HEALTH INSURANCE | Admitting: Occupational Therapy

## 2011-07-24 ENCOUNTER — Encounter: Payer: PRIVATE HEALTH INSURANCE | Admitting: Occupational Therapy

## 2011-08-06 ENCOUNTER — Ambulatory Visit: Payer: PRIVATE HEALTH INSURANCE | Admitting: Physical Medicine and Rehabilitation

## 2011-08-19 ENCOUNTER — Encounter
Payer: PRIVATE HEALTH INSURANCE | Attending: Physical Medicine & Rehabilitation | Admitting: Physical Medicine & Rehabilitation

## 2011-08-19 DIAGNOSIS — M259 Joint disorder, unspecified: Secondary | ICD-10-CM | POA: Insufficient documentation

## 2011-08-19 DIAGNOSIS — I6330 Cerebral infarction due to thrombosis of unspecified cerebral artery: Secondary | ICD-10-CM

## 2011-08-19 DIAGNOSIS — I69959 Hemiplegia and hemiparesis following unspecified cerebrovascular disease affecting unspecified side: Secondary | ICD-10-CM | POA: Insufficient documentation

## 2011-08-19 DIAGNOSIS — R339 Retention of urine, unspecified: Secondary | ICD-10-CM

## 2011-08-19 DIAGNOSIS — I69920 Aphasia following unspecified cerebrovascular disease: Secondary | ICD-10-CM

## 2011-08-19 DIAGNOSIS — G8110 Spastic hemiplegia affecting unspecified side: Secondary | ICD-10-CM

## 2011-08-19 NOTE — Assessment & Plan Note (Signed)
Joseph [name] is back regarding his spastic right hemiparesis.  He had his new hinged AFO and this seems to be working fairly well.  He had good results with the right tibial phenol injection as well as the upper extremity Botox.  He has some right shoulder tightness, but in the most part, he is doing fairly well.  His significant other asked about diet and want some recommendations there.  She also states that Joseph [name] as well as the patient himself states that he wants to drive.  He has done some driving apparently already.  Joseph [name] mood has been better for the most part.  REVIEW OF SYSTEMS:  Notable for anxiety and occasional constipation. Full 12-point review is in the written health and history section of the chart.  SOCIAL HISTORY:  The patient is single, living with his caregiver.  PHYSICAL EXAMINATION:  VITAL SIGNS:  Blood pressure is 123/62, pulse 75, respiratory rate is 18, satting 94% on room air. GENERAL:  The patient is pleasant, alert, and oriented x3.  Speech is better.  He still perseverates on certain words and phrases, but is able to communicate his thoughts with extra time and when he tries to relax. His resting tone is better in the right lower extremity.  He still has significant weakness in the ankle dorsiflexors of the foot.  Right upper extremity has trace to 1/4 tone at the wrist and finger flexors as well as the pec major and minor as well as teres major.  Biceps is only trace to absent from a tone standpoint.  Strength is underlying in the right upper extremities in the 1-2/5 range at best.  The patient ambulated for me today with his cane and did quite well with his weight shift.  When he removed the cane from left hand and tended to lean heavily to the left side.  The right knee still tends to hyperextend with weightbearing, it was better with the cane in this regard.  ASSESSMENT: 1. Left hemorrhagic stroke with right hemiparesis. 2. Spared right-sided  spasticity.  PLAN: 1. The patient is doing fairly nicely with the injections in the     brace.  I asked him to work on quality of his movement rather than     quantity and speed. 2. We discussed diet at length.  I advised him to work on a balanced     diet and to get away from processed and fast foods.  He is gaining     some weight and he is to add vegetables and more balance to his     diet in general.  He had some counseling in the past, so I do not     know that this is needed again.  The patient understands and     expressed willingness to improve in this area. 3. I would like to make a referral for a formal driving evaluation.     This was done today.  I will not let him behind the wheel without a     formal comprehensive eval. 4. Continue with other medications for pain including his tramadol.     He is on Cymbalta 30 mg daily already. 5. Set patient up for Botox injections 300 units to the right pec     major, pec minor, teres major, FDP, and FDS musculature.     Ranelle Oyster, M.D. Electronically Signed    ZTS/MedQ D:  08/19/2011 13:32:45  T:  08/19/2011 21:58:37  Job #:  [id number]

## 2011-09-17 ENCOUNTER — Telehealth: Payer: Self-pay

## 2011-09-17 NOTE — Telephone Encounter (Signed)
He needs to be seen

## 2011-09-17 NOTE — Telephone Encounter (Signed)
Please advise if ok to refill tramadol for pt. Thanks

## 2011-09-19 NOTE — Telephone Encounter (Signed)
Joseph [name] More Detail >>      Author Geeslin      Joseph [name]    MRN: [id number] DOB: July 13, 1961     Pt Work: [phone] Pt Home: [phone]      Message     Care giver Ms Tincher refused appt-- you are welcome        Sent: Fri September 19, 2011  9:43 AM

## 2011-09-19 NOTE — Telephone Encounter (Signed)
Pharmacy notified of denial per MD/// called lmov[facility] for christine (caregiver) to make appt.

## 2011-10-01 ENCOUNTER — Encounter
Payer: PRIVATE HEALTH INSURANCE | Attending: Physical Medicine & Rehabilitation | Admitting: Physical Medicine & Rehabilitation

## 2011-10-01 DIAGNOSIS — G8110 Spastic hemiplegia affecting unspecified side: Secondary | ICD-10-CM | POA: Insufficient documentation

## 2011-10-01 NOTE — Procedures (Signed)
Joseph [name], Joseph [name]               ACCOUNT NO.:  [id number]  MEDICAL RECORD NO.:  [id number]           PATIENT TYPE:  O  LOCATION:  PRM                          FACILITY:  [facility]  PHYSICIAN:  Ranelle Oyster, M.D.DATE OF BIRTH:  Feb 28, 1961  DATE OF PROCEDURE:  10/01/2011 DATE OF DISCHARGE:                              OPERATIVE REPORT  PROCEDURE:  Botox injection.  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  DESCRIPTION OF PROCEDURE:  Joseph [name] presented for a Botox injection today.  After informed consent, we injected the right pectoralis major, minor as well as the FDP, FDS, and pronator teres muscles of the right forearm.  We used a total of 300 units of Botox.  Each 100 units of Botox was diluted 1 mL preservative-free normal saline.  The patient tolerated it quite well.  Post injection instructions were given.  I utilized EMG guidance for all muscle injections.  We utilized approximately 2 injection points for each muscle injected.  Incidentally the patient has resumed driving despite not completing a driving evaluation.  He was informed that I was not in agreement with this but has continued to do so otherwise, so I want to note this for the record.  He is going for a bioness trial in [place] tomorrow in [place] to work on that.  We will consider scheduling, however, for therapy for his gait and spasticity in right upper extremity.  Otherwise, I will see him back in 3 months.     Ranelle Oyster, M.D. Electronically Signed    ZTS/MEDQ  D:  10/01/2011 10:43:45  T:  10/01/2011 11:40:34  Job:  [id number]

## 2011-10-02 DIAGNOSIS — G8110 Spastic hemiplegia affecting unspecified side: Secondary | ICD-10-CM

## 2011-10-29 ENCOUNTER — Telehealth: Payer: Self-pay

## 2011-10-29 MED ORDER — FLUTICASONE PROPIONATE 50 MCG/ACT NA SUSP: 1.0000 | Freq: Every day | NASAL | Status: DC

## 2011-10-29 NOTE — Telephone Encounter (Signed)
RX called in and pt notified.

## 2011-10-29 NOTE — Telephone Encounter (Signed)
Received refill request from pt requesting flonase to help with allergies. Please advise if ok

## 2011-10-29 NOTE — Telephone Encounter (Signed)
yes

## 2011-12-10 ENCOUNTER — Ambulatory Visit: Payer: PRIVATE HEALTH INSURANCE | Admitting: Internal Medicine

## 2011-12-19 ENCOUNTER — Other Ambulatory Visit: Payer: Self-pay

## 2011-12-19 MED ORDER — DULOXETINE HCL 30 MG PO CPEP: 30.0000 mg | ORAL_CAPSULE | Freq: Every day | ORAL | Status: DC

## 2011-12-19 NOTE — Telephone Encounter (Signed)
Refill request

## 2011-12-29 ENCOUNTER — Telehealth: Payer: Self-pay

## 2011-12-29 MED ORDER — ZOLPIDEM TARTRATE 10 MG PO TABS: 10.0000 mg | ORAL_TABLET | Freq: Every evening | ORAL | Status: DC | PRN

## 2011-12-29 NOTE — Telephone Encounter (Signed)
1 month only, he is due for a f/up

## 2011-12-29 NOTE — Telephone Encounter (Signed)
Please advise if ok to refill zolpidem tartrate 10mg. Thanks 

## 2011-12-30 ENCOUNTER — Encounter
Payer: PRIVATE HEALTH INSURANCE | Attending: Physical Medicine & Rehabilitation | Admitting: Physical Medicine & Rehabilitation

## 2012-01-16 ENCOUNTER — Other Ambulatory Visit: Payer: Self-pay | Admitting: Internal Medicine

## 2012-01-30 ENCOUNTER — Encounter: Payer: Self-pay | Admitting: Internal Medicine

## 2012-01-30 ENCOUNTER — Ambulatory Visit (INDEPENDENT_AMBULATORY_CARE_PROVIDER_SITE_OTHER): Payer: PRIVATE HEALTH INSURANCE | Admitting: Internal Medicine

## 2012-01-30 ENCOUNTER — Other Ambulatory Visit (INDEPENDENT_AMBULATORY_CARE_PROVIDER_SITE_OTHER): Payer: PRIVATE HEALTH INSURANCE

## 2012-01-30 DIAGNOSIS — I6350 Cerebral infarction due to unspecified occlusion or stenosis of unspecified cerebral artery: Secondary | ICD-10-CM

## 2012-01-30 DIAGNOSIS — E781 Pure hyperglyceridemia: Secondary | ICD-10-CM

## 2012-01-30 DIAGNOSIS — R7309 Other abnormal glucose: Secondary | ICD-10-CM

## 2012-01-30 DIAGNOSIS — F329 Major depressive disorder, single episode, unspecified: Secondary | ICD-10-CM

## 2012-01-30 DIAGNOSIS — R5381 Other malaise: Secondary | ICD-10-CM

## 2012-01-30 DIAGNOSIS — R5383 Other fatigue: Secondary | ICD-10-CM

## 2012-01-30 DIAGNOSIS — R319 Hematuria, unspecified: Secondary | ICD-10-CM

## 2012-01-30 DIAGNOSIS — J309 Allergic rhinitis, unspecified: Secondary | ICD-10-CM | POA: Insufficient documentation

## 2012-01-30 DIAGNOSIS — F3289 Other specified depressive episodes: Secondary | ICD-10-CM

## 2012-01-30 LAB — CBC WITH DIFFERENTIAL/PLATELET
Basophils Absolute: 0 10*3/uL (ref 0.0–0.1)
Eosinophils Absolute: 0.1 10*3/uL (ref 0.0–0.7)
Hemoglobin: 16.3 g/dL (ref 13.0–17.0)
Lymphocytes Relative: 21.8 % (ref 12.0–46.0)
MCHC: 34.2 g/dL (ref 30.0–36.0)
MCV: 95.6 fl (ref 78.0–100.0)
Monocytes Absolute: 0.5 10*3/uL (ref 0.1–1.0)
Neutro Abs: 3.6 10*3/uL (ref 1.4–7.7)
Neutrophils Relative %: 67.1 % (ref 43.0–77.0)
RDW: 13 % (ref 11.5–14.6)

## 2012-01-30 LAB — URINALYSIS, ROUTINE W REFLEX MICROSCOPIC
Bilirubin Urine: NEGATIVE
Ketones, ur: NEGATIVE
Leukocytes, UA: NEGATIVE
Nitrite: NEGATIVE
Urobilinogen, UA: 0.2 (ref 0.0–1.0)
pH: 6 (ref 5.0–8.0)

## 2012-01-30 LAB — COMPREHENSIVE METABOLIC PANEL
ALT: 27 U/L (ref 0–53)
AST: 21 U/L (ref 0–37)
Albumin: 4.4 g/dL (ref 3.5–5.2)
Alkaline Phosphatase: 56 U/L (ref 39–117)
Calcium: 9.3 mg/dL (ref 8.4–10.5)
Chloride: 105 mEq/L (ref 96–112)
Potassium: 4.6 mEq/L (ref 3.5–5.1)

## 2012-01-30 LAB — LIPID PANEL
HDL: 32.2 mg/dL — ABNORMAL LOW (ref >39.00)
Total CHOL/HDL Ratio: 4

## 2012-01-30 LAB — TSH: TSH: 0.63 u[IU]/mL (ref 0.35–5.50)

## 2012-01-30 MED ORDER — FLUTICASONE PROPIONATE 50 MCG/ACT NA SUSP: 1.0000 | Freq: Every day | NASAL | Status: DC

## 2012-01-30 MED ORDER — DULOXETINE HCL 60 MG PO CPEP: 60.0000 mg | ORAL_CAPSULE | Freq: Every day | ORAL | Status: DC

## 2012-01-30 MED ORDER — ZOLPIDEM TARTRATE ER 12.5 MG PO TBCR: 12.5000 mg | EXTENDED_RELEASE_TABLET | Freq: Every evening | ORAL | Status: DC | PRN

## 2012-01-30 MED ORDER — DESLORATADINE 5 MG PO TABS: 5.0000 mg | ORAL_TABLET | Freq: Every day | ORAL | Status: DC

## 2012-01-30 MED ORDER — ASPIRIN 81 MG PO TABS: 81.0000 mg | ORAL_TABLET | Freq: Every day | ORAL | Status: AC

## 2012-01-30 NOTE — Patient Instructions (Signed)
Hypertriglyceridemia  Diet for High blood levels of Triglycerides Most fats in food are triglycerides. Triglycerides in your blood are stored as fat in your body. High levels of triglycerides in your blood may put you at a greater risk for heart disease and stroke.  Normal triglyceride levels are less than 150 mg/dL. Borderline high levels are 150-199 mg/dl. High levels are 200 - 499 mg/dL, and very high triglyceride levels are greater than 500 mg/dL. The decision to treat high triglycerides is generally based on the level. For people with borderline or high triglyceride levels, treatment includes weight loss and exercise. Drugs are recommended for people with very high triglyceride levels. Many people who need treatment for high triglyceride levels have metabolic syndrome. This syndrome is a collection of disorders that often include: insulin resistance, high blood pressure, blood clotting problems, high cholesterol and triglycerides. TESTING PROCEDURE FOR TRIGLYCERIDES  You should not eat 4 hours before getting your triglycerides measured. The normal range of triglycerides is between 10 and 250 milligrams per deciliter (mg/dl). Some people may have extreme levels (1000 or above), but your triglyceride level may be too high if it is above 150 mg/dl, depending on what other risk factors you have for heart disease.   People with high blood triglycerides may also have high blood cholesterol levels. If you have high blood cholesterol as well as high blood triglycerides, your risk for heart disease is probably greater than if you only had high triglycerides. High blood cholesterol is one of the main risk factors for heart disease.  CHANGING YOUR DIET  Your weight can affect your blood triglyceride level. If you are more than 20% above your ideal body weight, you may be able to lower your blood triglycerides by losing weight. Eating less and exercising regularly is the best way to combat this. Fat provides  more calories than any other food. The best way to lose weight is to eat less fat. Only 30% of your total calories should come from fat. Less than 7% of your diet should come from saturated fat. A diet low in fat and saturated fat is the same as a diet to decrease blood cholesterol. By eating a diet lower in fat, you may lose weight, lower your blood cholesterol, and lower your blood triglyceride level.  Eating a diet low in fat, especially saturated fat, may also help you lower your blood triglyceride level. Ask your dietitian to help you figure how much fat you can eat based on the number of calories your caregiver has prescribed for you.  Exercise, in addition to helping with weight loss may also help lower triglyceride levels.   Alcohol can increase blood triglycerides. You may need to stop drinking alcoholic beverages.   Too much carbohydrate in your diet may also increase your blood triglycerides. Some complex carbohydrates are necessary in your diet. These may include bread, rice, potatoes, other starchy vegetables and cereals.   Reduce "simple" carbohydrates. These may include pure sugars, candy, honey, and jelly without losing other nutrients. If you have the kind of high blood triglycerides that is affected by the amount of carbohydrates in your diet, you will need to eat less sugar and less high-sugar foods. Your caregiver can help you with this.   Adding 2-4 grams of fish oil (EPA+ DHA) may also help lower triglycerides. Speak with your caregiver before adding any supplements to your regimen.  Following the Diet  Maintain your ideal weight. Your caregivers can help you with a diet. Generally,   eating less food and getting more exercise will help you lose weight. Joining a weight control group may also help. Ask your caregivers for a good weight control group in your area.  Eat low-fat foods instead of high-fat foods. This can help you lose weight too.  These foods are lower in fat. Eat MORE  of these:   Dried beans, peas, and lentils.   Egg whites.   Low-fat cottage cheese.   Fish.   Lean cuts of meat, such as round, sirloin, rump, and flank (cut extra fat off meat you fix).   Whole grain breads, cereals and pasta.   Skim and nonfat dry milk.   Low-fat yogurt.   Poultry without the skin.   Cheese made with skim or part-skim milk, such as mozzarella, parmesan, farmers', ricotta, or pot cheese.  These are higher fat foods. Eat LESS of these:   Whole milk and foods made from whole milk, such as American, blue, cheddar, monterey jack, and swiss cheese   High-fat meats, such as luncheon meats, sausages, knockwurst, bratwurst, hot dogs, ribs, corned beef, ground pork, and regular ground beef.   Fried foods.  Limit saturated fats in your diet. Substituting unsaturated fat for saturated fat may decrease your blood triglyceride level. You will need to read package labels to know which products contain saturated fats.  These foods are high in saturated fat. Eat LESS of these:   Fried pork skins.   Whole milk.   Skin and fat from poultry.   Palm oil.   Butter.   Shortening.   Cream cheese.   Bacon.   Margarines and baked goods made from listed oils.   Vegetable shortenings.   Chitterlings.   Fat from meats.   Coconut oil.   Palm kernel oil.   Lard.   Cream.   Sour cream.   Fatback.   Coffee whiteners and non-dairy creamers made with these oils.   Cheese made from whole milk.  Use unsaturated fats (both polyunsaturated and monounsaturated) moderately. Remember, even though unsaturated fats are better than saturated fats; you still want a diet low in total fat.  These foods are high in unsaturated fat:   Canola oil.   Sunflower oil.   Mayonnaise.   Almonds.   Peanuts.   Pine nuts.   Margarines made with these oils.   Safflower oil.   Olive oil.   Avocados.   Cashews.   Peanut butter.   Sunflower seeds.   Soybean oil.     Peanut oil.   Olives.   Pecans.   Walnuts.   Pumpkin seeds.  Avoid sugar and other high-sugar foods. This will decrease carbohydrates without decreasing other nutrients. Sugar in your food goes rapidly to your blood. When there is excess sugar in your blood, your liver may use it to make more triglycerides. Sugar also contains calories without other important nutrients.  Eat LESS of these:   Sugar, brown sugar, powdered sugar, jam, jelly, preserves, honey, syrup, molasses, pies, candy, cakes, cookies, frosting, pastries, colas, soft drinks, punches, fruit drinks, and regular gelatin.   Avoid alcohol. Alcohol, even more than sugar, may increase blood triglycerides. In addition, alcohol is high in calories and low in nutrients. Ask for sparkling water, or a diet soft drink instead of an alcoholic beverage.  Suggestions for planning and preparing meals   Bake, broil, grill or roast meats instead of frying.   Remove fat from meats and skin from poultry before cooking.   Add spices,   herbs, lemon juice or vinegar to vegetables instead of salt, rich sauces or gravies.   Use a non-stick skillet without fat or use no-stick sprays.   Cool and refrigerate stews and broth. Then remove the hardened fat floating on the surface before serving.   Refrigerate meat drippings and skim off fat to make low-fat gravies.   Serve more fish.   Use less butter, margarine and other high-fat spreads on bread or vegetables.   Use skim or reconstituted non-fat dry milk for cooking.   Cook with low-fat cheeses.   Substitute low-fat yogurt or cottage cheese for all or part of the sour cream in recipes for sauces, dips or congealed salads.   Use half yogurt/half mayonnaise in salad recipes.   Substitute evaporated skim milk for cream. Evaporated skim milk or reconstituted non-fat dry milk can be whipped and substituted for whipped cream in certain recipes.   Choose fresh fruits for dessert instead of  high-fat foods such as pies or cakes. Fruits are naturally low in fat.  When Dining Out   Order low-fat appetizers such as fruit or vegetable juice, pasta with vegetables or tomato sauce.   Select clear, rather than cream soups.   Ask that dressings and gravies be served on the side. Then use less of them.   Order foods that are baked, broiled, poached, steamed, stir-fried, or roasted.   Ask for margarine instead of butter, and use only a small amount.   Drink sparkling water, unsweetened tea or coffee, or diet soft drinks instead of alcohol or other sweet beverages.  QUESTIONS AND ANSWERS ABOUT OTHER FATS IN THE BLOOD: SATURATED FAT, TRANS FAT, AND CHOLESTEROL What is trans fat? Trans fat is a type of fat that is formed when vegetable oil is hardened through a process called hydrogenation. This process helps makes foods more solid, gives them shape, and prolongs their shelf life. Trans fats are also called hydrogenated or partially hydrogenated oils.  What do saturated fat, trans fat, and cholesterol in foods have to do with heart disease? Saturated fat, trans fat, and cholesterol in the diet all raise the level of LDL "bad" cholesterol in the blood. The higher the LDL cholesterol, the greater the risk for coronary heart disease (CHD). Saturated fat and trans fat raise LDL similarly.  What foods contain saturated fat, trans fat, and cholesterol? High amounts of saturated fat are found in animal products, such as fatty cuts of meat, chicken skin, and full-fat dairy products like butter, whole milk, cream, and cheese, and in tropical vegetable oils such as palm, palm kernel, and coconut oil. Trans fat is found in some of the same foods as saturated fat, such as vegetable shortening, some margarines (especially hard or stick margarine), crackers, cookies, baked goods, fried foods, salad dressings, and other processed foods made with partially hydrogenated vegetable oils. Small amounts of trans fat  also occur naturally in some animal products, such as milk products, beef, and lamb. Foods high in cholesterol include liver, other organ meats, egg yolks, shrimp, and full-fat dairy products. How can I use the new food label to make heart-healthy food choices? Check the Nutrition Facts panel of the food label. Choose foods lower in saturated fat, trans fat, and cholesterol. For saturated fat and cholesterol, you can also use the Percent Daily Value (%DV): 5% DV or less is low, and 20% DV or more is high. (There is no %DV for trans fat.) Use the Nutrition Facts panel to choose foods low in   saturated fat and cholesterol, and if the trans fat is not listed, read the ingredients and limit products that list shortening or hydrogenated or partially hydrogenated vegetable oil, which tend to be high in trans fat. POINTS TO REMEMBER: YOU NEED A LITTLE TLC (THERAPEUTIC LIFESTYLE CHANGES)  Discuss your risk for heart disease with your caregivers, and take steps to reduce risk factors.   Change your diet. Choose foods that are low in saturated fat, trans fat, and cholesterol.   Add exercise to your daily routine if it is not already being done. Participate in physical activity of moderate intensity, like brisk walking, for at least 30 minutes on most, and preferably all days of the week. No time? Break the 30 minutes into three, 10-minute segments during the day.   Stop smoking. If you do smoke, contact your caregiver to discuss ways in which they can help you quit.   Do not use street drugs.   Maintain a normal weight.   Maintain a healthy blood pressure.   Keep up with your blood work for checking the fats in your blood as directed by your caregiver.  Document Released: 09/04/2004 Document Revised: 07/30/2011 Document Reviewed: 04/02/2009 [employer] Patient Information 2012 [employer]. 

## 2012-01-30 NOTE — Progress Notes (Signed)
Subjective:    Patient ID: Joseph [name], male    DOB: [birth year], 51 y.o.   MRN: [id number]  Hyperlipidemia This is a chronic problem. The current episode started more than 1 year ago. The problem is uncontrolled. Recent lipid tests were reviewed and are variable. Exacerbating diseases include obesity. He has no history of chronic renal disease, diabetes, hypothyroidism, liver disease or nephrotic syndrome. Factors aggravating his hyperlipidemia include no known factors. 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 Pertinent negatives include no chest pain, leg pain, myalgias or shortness of breath. Current antihyperlipidemic treatment includes diet change. The current treatment provides moderate improvement of lipids. There are no compliance problems.       Review of Systems  Constitutional: Positive for fatigue. Negative for fever, chills, diaphoresis, appetite change and unexpected weight change.  HENT: Positive for congestion, rhinorrhea, sneezing and postnasal drip. Negative for hearing loss, ear pain, nosebleeds, sore throat, facial swelling, drooling, mouth sores, trouble swallowing, neck pain, neck stiffness, dental problem, voice change, sinus pressure, tinnitus and ear discharge.   Eyes: Negative.   Respiratory: Negative for cough, choking, chest tightness, shortness of breath, wheezing and stridor.   Cardiovascular: Negative for chest pain, palpitations and leg swelling.  Gastrointestinal: Negative for nausea, vomiting, abdominal pain, diarrhea, constipation, blood in stool, anal bleeding and rectal pain.  Genitourinary: Negative.   Musculoskeletal: Negative for myalgias, back pain, joint swelling, arthralgias and gait problem.  Skin: Negative for color change, pallor, rash and wound.  Neurological: Positive for focal weakness (rue and rle s/p cva, this is  improving).  Hematological: Negative for adenopathy. Does not bruise/bleed easily.  Psychiatric/Behavioral: Positive for dysphoric mood (some irritability and anhedonia). Negative for suicidal ideas, hallucinations, behavioral problems, confusion, sleep disturbance, self-injury, decreased concentration and agitation. The patient is not nervous/anxious and is not hyperactive.        Objective:   Physical Exam  Vitals reviewed. Constitutional: He is oriented to person, place, and time. He appears well-developed and well-nourished. No distress.  HENT:  Head: Normocephalic and atraumatic.  Nose: Nose normal.  Mouth/Throat: Oropharynx is clear and moist. No oropharyngeal exudate.  Eyes: Conjunctivae are normal. Right eye exhibits no discharge. Left eye exhibits no discharge. No scleral icterus.  Neck: Normal range of motion. Neck supple. No JVD present. No tracheal deviation present. No thyromegaly present.  Cardiovascular: Normal rate, regular rhythm, normal heart sounds and intact distal pulses.  Exam reveals no gallop and no friction rub.   No murmur heard. Pulmonary/Chest: Effort normal and breath sounds normal. No stridor. No respiratory distress. He has no wheezes. He has no rales. He exhibits no tenderness.  Abdominal: Soft. Bowel sounds are normal. He exhibits no distension and no mass. There is no tenderness. There is no rebound and no guarding.  Musculoskeletal: Normal range of motion. He exhibits no edema and no tenderness.  Lymphadenopathy:    He has no cervical adenopathy.  Neurological: He is alert and oriented to person, place, and time. He is not disoriented. He displays atrophy (rue and rle). He displays no tremor. A cranial nerve deficit and sensory deficit is present. He exhibits abnormal muscle tone (rue 0/5 rle 1/5). He displays no seizure activity. Coordination and gait abnormal. He displays Babinski's sign on the right side.  Reflex Scores:      Tricep reflexes are 2+ on  the right  side and 1+ on the left side.      Bicep reflexes are 2+ on the right side and 1+ on the left side.      Brachioradialis reflexes are 2+ on the right side and 1+ on the left side.      Patellar reflexes are 2+ on the right side and 1+ on the left side.      Achilles reflexes are 2+ on the right side and 1+ on the left side. Skin: Skin is warm and dry. No rash noted. He is not diaphoretic. No erythema. No pallor.  Psychiatric: He has a normal mood and affect. His behavior is normal. Judgment and thought content normal.     Lab Results  Component Value Date   WBC 5.7 06/30/2011   HGB 16.5 06/30/2011   HCT 51.4 06/30/2011   PLT 215.0 06/30/2011   GLUCOSE 87 12/30/2010   CHOL 136 06/30/2011   TRIG 156.0* 06/30/2011   HDL 34.60* 06/30/2011   LDLDIRECT 91.1 12/30/2010   LDLCALC 70 06/30/2011   ALT 19 12/30/2010   AST 19 12/30/2010   NA 138 12/30/2010   K 4.0 12/30/2010   CL 100 12/30/2010   CREATININE 0.9 12/30/2010   BUN 11 12/30/2010   CO2 30 12/30/2010   TSH 0.82 06/30/2011   INR 1.15 12/21/2009   HGBA1C  Value: 5.4 (NOTE) The ADA recommends the following therapeutic goal for glycemic control related to Hgb A1c measurement: Goal of therapy: <6.5 Hgb A1c  Reference: American Diabetes Association: Clinical Practice Recommendations 2010, Diabetes Care, 2010, 33: (Suppl  1). 01/13/2010       Assessment & Plan:

## 2012-01-31 ENCOUNTER — Encounter: Payer: Self-pay | Admitting: Internal Medicine

## 2012-01-31 NOTE — Assessment & Plan Note (Signed)
Repeat UA today

## 2012-01-31 NOTE — Assessment & Plan Note (Signed)
Start clarinex and continue flonase ns

## 2012-01-31 NOTE — Assessment & Plan Note (Signed)
For an FLP today, he will not take a statin

## 2012-01-31 NOTE — Assessment & Plan Note (Signed)
Check his a1c today 

## 2012-01-31 NOTE — Assessment & Plan Note (Signed)
Unchanged, continue cymbalta, higher dosage

## 2012-01-31 NOTE — Assessment & Plan Note (Signed)
Check labs to look for secondary causes such as low T, hypo T, anemia, etc

## 2012-01-31 NOTE — Assessment & Plan Note (Signed)
Improvement noted, he was asked to start otc asa 81 mg per day

## 2012-02-02 LAB — TESTOSTERONE, FREE, TOTAL, SHBG
Sex Hormone Binding: 33 nmol/L (ref 13–71)
Testosterone, Free: 78.4 pg/mL (ref 47.0–244.0)
Testosterone-% Free: 2 % (ref 1.6–2.9)
Testosterone: 387.54 ng/dL (ref 250–890)

## 2012-02-11 ENCOUNTER — Telehealth: Payer: Self-pay | Admitting: Physical Medicine & Rehabilitation

## 2012-02-11 NOTE — Telephone Encounter (Signed)
Wants to schedule Botox.  Can we schedule?

## 2012-02-11 NOTE — Telephone Encounter (Signed)
Ok to schedule.

## 2012-02-11 NOTE — Telephone Encounter (Signed)
We cannot reschedule.  Too many no shows and cancels

## 2012-02-24 ENCOUNTER — Telehealth: Payer: Self-pay | Admitting: Internal Medicine

## 2012-02-24 NOTE — Telephone Encounter (Signed)
He needs to be seen, I have not evaluated him for this

## 2012-02-24 NOTE — Telephone Encounter (Signed)
Pt caregiver/christine is requesting med for urine frequency.  Pt's ph# [phone]--[facility]: [phone]

## 2012-02-26 ENCOUNTER — Ambulatory Visit: Payer: Self-pay | Admitting: Internal Medicine

## 2012-03-10 ENCOUNTER — Other Ambulatory Visit (INDEPENDENT_AMBULATORY_CARE_PROVIDER_SITE_OTHER): Payer: Self-pay

## 2012-03-10 ENCOUNTER — Encounter: Payer: Self-pay | Admitting: Internal Medicine

## 2012-03-10 ENCOUNTER — Ambulatory Visit (INDEPENDENT_AMBULATORY_CARE_PROVIDER_SITE_OTHER): Payer: PRIVATE HEALTH INSURANCE | Admitting: Internal Medicine

## 2012-03-10 VITALS — BP 98/76 | HR 87 | Temp 97.1°F | Resp 22

## 2012-03-10 DIAGNOSIS — N400 Enlarged prostate without lower urinary tract symptoms: Secondary | ICD-10-CM

## 2012-03-10 DIAGNOSIS — N419 Inflammatory disease of prostate, unspecified: Secondary | ICD-10-CM

## 2012-03-10 DIAGNOSIS — R7309 Other abnormal glucose: Secondary | ICD-10-CM

## 2012-03-10 DIAGNOSIS — R350 Frequency of micturition: Secondary | ICD-10-CM

## 2012-03-10 DIAGNOSIS — R519 Headache, unspecified: Secondary | ICD-10-CM | POA: Insufficient documentation

## 2012-03-10 DIAGNOSIS — R51 Headache: Secondary | ICD-10-CM | POA: Insufficient documentation

## 2012-03-10 DIAGNOSIS — R3915 Urgency of urination: Secondary | ICD-10-CM

## 2012-03-10 DIAGNOSIS — R319 Hematuria, unspecified: Secondary | ICD-10-CM

## 2012-03-10 DIAGNOSIS — I6350 Cerebral infarction due to unspecified occlusion or stenosis of unspecified cerebral artery: Secondary | ICD-10-CM

## 2012-03-10 LAB — BASIC METABOLIC PANEL
BUN: 13 mg/dL (ref 6–23)
Chloride: 103 mEq/L (ref 96–112)
Creatinine, Ser: 0.8 mg/dL (ref 0.4–1.5)
GFR: 102.84 mL/min (ref >60.00)
Glucose, Bld: 108 mg/dL — ABNORMAL HIGH (ref 70–99)
Potassium: 3.7 mEq/L (ref 3.5–5.1)

## 2012-03-10 LAB — POCT URINALYSIS DIPSTICK
Bilirubin, UA: NEGATIVE
Glucose, UA: NEGATIVE
Ketones, UA: NEGATIVE
Spec Grav, UA: <=1.005
pH, UA: 6

## 2012-03-10 MED ORDER — TADALAFIL 5 MG PO TABS: 5.0000 mg | ORAL_TABLET | Freq: Every day | ORAL | Status: DC | PRN

## 2012-03-10 MED ORDER — SULFAMETHOXAZOLE-TRIMETHOPRIM 800-160 MG PO TABS
1.0000 | ORAL_TABLET | Freq: Two times a day (BID) | ORAL | Status: AC
Start: 2012-03-10 — End: 2012-03-20

## 2012-03-10 NOTE — Progress Notes (Signed)
Subjective:    Patient ID: Joseph [name], male    DOB: June 08, 1961, 51 y.o.   MRN: [id number]  Headache  This is a new problem. The current episode started in the past 7 days. The problem occurs constantly. The problem has been unchanged. The pain is located in the left unilateral and temporal region. The pain does not radiate. The pain quality is similar to prior headaches. The quality of the pain is described as throbbing. The pain is at a severity of 2/10. The pain is mild. Associated symptoms include facial sweating and weakness (chronic, unchanged right arm and right leg). Pertinent negatives include no abdominal pain, abnormal behavior, anorexia, back pain, blurred vision, coughing, dizziness, drainage, ear pain, eye pain, eye redness, eye watering, fever, hearing loss, insomnia, loss of balance, muscle aches, nausea, neck pain, numbness, phonophobia, photophobia, rhinorrhea, scalp tenderness, seizures, sinus pressure, sore throat, swollen glands, tingling, tinnitus, visual change, vomiting or weight loss. He has tried nothing for the symptoms. 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 The current episode started more than 1 month ago. The problem has been gradually worsening since onset. Irritative symptoms include frequency, nocturia and urgency. Obstructive symptoms include dribbling, incomplete emptying, a slower stream and straining. Obstructive symptoms do not include an intermittent stream or a weak stream. Associated symptoms include hematuria. Pertinent negatives include no chills, dysuria, genital pain, hesitancy, nausea or vomiting. AUA score is 0-7. He is sexually active. Past treatments include nothing.      Review of Systems  Constitutional: Negative for fever, chills, weight loss, diaphoresis, activity change, appetite change, fatigue and unexpected weight change.  HENT: Negative for hearing loss, ear pain, sore  throat, rhinorrhea, neck pain, sinus pressure and tinnitus.   Eyes: Negative.  Negative for blurred vision, photophobia, pain and redness.  Respiratory: Negative for apnea, cough, choking, chest tightness, shortness of breath, wheezing and stridor.   Cardiovascular: Negative for chest pain, palpitations and leg swelling.  Gastrointestinal: Negative for nausea, vomiting, abdominal pain, diarrhea, constipation, blood in stool, abdominal distention, anal bleeding, rectal pain and anorexia.  Genitourinary: Positive for urgency, frequency, hematuria, incomplete emptying and nocturia. Negative for dysuria, hesitancy, flank pain, decreased urine volume, discharge, penile swelling, scrotal swelling, enuresis, difficulty urinating, genital sores, penile pain and testicular pain.  Musculoskeletal: Negative for myalgias, back pain, joint swelling, arthralgias and gait problem.  Skin: Negative for color change, pallor, rash and wound.  Neurological: Positive for speech difficulty (chronic since his CVA), weakness (chronic, unchanged right arm and right leg) and headaches. Negative for dizziness, tingling, tremors, seizures, syncope, facial asymmetry, light-headedness, numbness and loss of balance.  Hematological: Negative for adenopathy. Does not bruise/bleed easily.  Psychiatric/Behavioral: Negative.  The patient does not have insomnia.        Objective:   Physical Exam  Vitals reviewed. Constitutional: He appears well-developed and well-nourished. No distress.  HENT:  Head: Normocephalic and atraumatic.  Mouth/Throat: Oropharynx is clear and moist. No oropharyngeal exudate.  Eyes: Conjunctivae are normal. Right eye exhibits no discharge. Left eye exhibits no discharge. No scleral icterus.  Neck: Normal range of motion. Neck supple. No JVD present. No tracheal deviation present. No thyromegaly present.  Cardiovascular: Normal rate, regular rhythm, normal heart sounds and intact distal pulses.  Exam  reveals no gallop and no friction rub.   No murmur heard. Pulmonary/Chest: Effort normal and breath sounds normal. No stridor. No respiratory distress. He has no wheezes. He  has no rales. He exhibits no tenderness.  Abdominal: Soft. Bowel sounds are normal. He exhibits no distension and no mass. There is no tenderness. There is no rebound and no guarding. Hernia confirmed negative in the right inguinal area and confirmed negative in the left inguinal area.  Genitourinary: Rectum normal, testes normal and penis normal. Rectal exam shows no external hemorrhoid, no internal hemorrhoid, no fissure, no mass, no tenderness and anal tone normal. Guaiac negative stool. Prostate is enlarged (1+ hypertrophy L lobe > R lobe) and tender. Right testis shows no mass, no swelling and no tenderness. Right testis is descended. Left testis shows no mass, no swelling and no tenderness. Left testis is descended. Uncircumcised. No phimosis, paraphimosis, hypospadias, penile erythema or penile tenderness. No discharge found.  Musculoskeletal: Normal range of motion. He exhibits no edema and no tenderness.  Lymphadenopathy:    He has no cervical adenopathy.       Right: No inguinal adenopathy present.       Left: No inguinal adenopathy present.  Neurological: He is alert. He displays atrophy (RUE and RLE). He displays no tremor. A cranial nerve deficit is present. No sensory deficit. He exhibits abnormal muscle tone (paresis RUE and RLE). He displays no seizure activity. Coordination and gait abnormal. He displays Babinski's sign on the right side. He displays no Babinski's sign on the left side.  Reflex Scores:      Tricep reflexes are 3+ on the right side and 1+ on the left side.      Bicep reflexes are 3+ on the right side and 1+ on the left side.      Brachioradialis reflexes are 3+ on the right side and 1+ on the left side.      Patellar reflexes are 3+ on the right side and 1+ on the left side.      Achilles reflexes  are 3+ on the right side and 1+ on the left side. Skin: Skin is warm and dry. No rash noted. He is not diaphoretic. No erythema. No pallor.  Psychiatric: He has a normal mood and affect. His behavior is normal. Judgment and thought content normal.      Lab Results  Component Value Date   WBC 5.4 01/30/2012   HGB 16.3 01/30/2012   HCT 47.7 01/30/2012   PLT 207.0 01/30/2012   GLUCOSE 94 01/30/2012   CHOL 144 01/30/2012   TRIG 161.0* 01/30/2012   HDL 32.20* 01/30/2012   LDLDIRECT 91.1 12/30/2010   LDLCALC 80 01/30/2012   ALT 27 01/30/2012   AST 21 01/30/2012   NA 141 01/30/2012   K 4.6 01/30/2012   CL 105 01/30/2012   CREATININE 0.8 01/30/2012   BUN 13 01/30/2012   CO2 30 01/30/2012   TSH 0.63 01/30/2012   INR 1.15 12/21/2009   HGBA1C 5.5 01/30/2012      Assessment & Plan:

## 2012-03-10 NOTE — Assessment & Plan Note (Signed)
Recheck the BMP and a1c today

## 2012-03-10 NOTE — Assessment & Plan Note (Signed)
Start cialis °

## 2012-03-10 NOTE — Assessment & Plan Note (Signed)
Start bactrim-ds and check his PSA

## 2012-03-10 NOTE — Assessment & Plan Note (Signed)
I have asked him to get a CT scan done

## 2012-03-10 NOTE — Assessment & Plan Note (Signed)
Urology referral

## 2012-03-10 NOTE — Assessment & Plan Note (Signed)
Neurology referral at his request

## 2012-03-10 NOTE — Patient Instructions (Signed)
Prostatitis Prostatitis is an inflammation (the body's way of reacting to injury and/or infection) of the prostate gland. The prostate gland is a male organ. The gland is about the size and shape of a walnut. The prostate is located just below the bladder. It produces semen, which is a fluid that helps nourish and transport sperm. Prostatitis is the most common urinary tract problem in men younger than age 50. There are 4 categories of prostatitis:  I - Acute bacterial prostatitis.   II - Chronic bacterial prostatitis.   III - Chronic prostatitis and chronic pelvic pain syndrome (CPPS).   Inflammatory.   Non inflammatory.   IV - Asymptomatic inflammatory prostatitis.  Acute and chronic bacterial prostatitis are problems with bacterial infections of the prostate. "Acute" infection is usually a one-time problem. "Chronic" bacterial prostatitis is a condition with recurrent infection. It is usually caused by the same germ(bacteria). CPPS has symptoms similar to prostate infection. However, no infection is actually found. This condition can cause problems of ongoing pain. Currently, it cannot be cured. Treatments are available and aimed at symptom control.  Asymptomatic inflammatory prostatitis has no symptoms. It is a condition where infection-fighting cells are found by chance in the urine. The diagnosis is made most often during an exam for other conditions. Other conditions could be infertility or a high level of PSA (prostate-specific antigen) in the blood. SYMPTOMS  Symptoms can vary depending upon the type of prostatitis that exists. There can also be overlap in symptoms. This can make diagnosis difficult. Symptoms: For Acute bacterial prostatitis  Painful urination.   Fever or chills.   Muscle or joint pains.   Low back pain.   Low abdominal pain.   Inability to empty bladder completely.   Sudden urges to urinate.   Frequent urination during the day.   Difficulty starting  urine stream.   Need to urinate several times at night (nocturia).   Weak urine stream.   Urethral (tube that carries urine from the bladder out of the body) discharge and dribbling after urination.  For Chronic bacterial prostatitis  Rectal pain.   Pain in the testicles, penis, or tip of the penis.   Pain in the space between the anus and scrotum (perineum).   Low back pain.   Low abdominal pain.   Problems with sexual function.   Painful ejaculation.   Bloody semen.   Inability to empty bladder completely.   Painful urination.   Sudden urges to urinate.   Frequent urination during the day.   Difficulty starting urine stream.   Need to urinate several times at night (nocturia).   Weak urine stream.   Dribbling after urination.   Urethral discharge.  For Chronic prostatitis and chronic pelvic pain syndrome (CPPS) Symptoms are the same as those for chronic bacterial prostatitis. Problems with sexual function are often the reason for seeking care. This important problem should be discussed with your caregiver. For Asymptomatic inflammatory prostatitis As noted above, there are no symptoms with this condition. DIAGNOSIS   Your caregiver may perform a rectal exam. This exam is to determine if the prostate is swollen and tender.   Sometimes blood work is performed. This is done to see if your white blood cell count is elevated. The Prostate Specific Antigen (PSA) is also measured. PSA is a blood test that can help detect early prostate cancer.   A urinalysis is done to find out what type of infection is present if this is a suspected cause. An   additional urinalysis may be done after a digital rectal exam. This is to see if white blood cells are pushed out of the prostate and into the urine. A low-grade infection of the prostate may not be found on the first urinalysis.  In more difficult cases, your caregiver may advise other tests. Tests could include:  Urodynamics  -- Tests the function of the bladder and the organs involved in triggering and controlling normal urination.   Urine flow rate.   Cystoscopy -- In this procedure, a thin, telescope-like tube with a light and tiny camera attached (cystoscope) is inserted into the bladder through the urethra. This allows the caregiver to see the inside of the urethra and bladder.   Electromyography -- This procedure tests how the muscles and nerves of the bladder work. It is focused on the muscles that control the anus and pelvic floor. These are the muscles between the anus and scrotum.  In people who show no signs of infection, certain uncommon infections might be causing constant or recurrent symptoms. These uncommon infections are difficult to detect. More work in medicine may help find solutions to these problems. TREATMENT  Antibiotics are used to treat infections caused by germs. If the infection is not treated and becomes long lasting (chronic), it may become a lower grade infection with minor, continual problems. Without treatment, the prostate may develop a boil or furuncle (abscess). This may require surgical treatment. For those with chronic prostatitis and CPPS, it is important to work closely with your primary caregiver and urologist. For some, the medicines that are used to treat a non-cancerous, enlarged prostate (benign prostatic hypertrophy) may be helpful. Referrals to specialists other than urologists may be necessary. In rare cases when all treatments have been inadequate for pain control, an operation to remove the prostate may be recommended. This is very rare and before this is considered thorough discussion with your urologist is highly recommended.  In cases of secondary to chronic non-bacterial prostatitis, a good relationship with your urologist or primary caregiver is essential because it is often a recurrent prolonged condition that requires a good understanding of the causes and a commitment  to therapy aimed at controlling your symptoms. HOME CARE INSTRUCTIONS   Hot sitz baths for 20 minutes, 4 times per day, may help relieve pain.   Non-prescription pain killers may be used as your caregiver recommends if you have no allergies to them. Some illnesses or conditions prevent use of non-prescription drugs. If unsure, check with your caregiver. Take all medications as directed. Take the antibiotics for the prescribed length of time, even if you are feeling better.  SEEK MEDICAL CARE IF:   You have any worsening of the symptoms that originally brought you to your caregiver.   You have an oral temperature above 102 F (38.9 C).   You experience any side effects from medications prescribed.  SEEK IMMEDIATE MEDICAL CARE IF:   You have an oral temperature above 102 F (38.9 C), not controlled by medicine.   You have pain not relieved with medications.   You develop nausea, vomiting, lightheadedness, or have a fainting episode.   You are unable to urinate.   You pass bloody urine or clots.  Document Released: 11/14/2000 Document Revised: 11/06/2011 Document Reviewed: 10/20/2011 [facility] Patient Information 2012 [place], [place].Headache, General, Unknown Cause The specific cause of your headache may not have been found today. There are many causes and types of headache. A few common ones are:  Tension headache.  Migraine.   Infections (examples: dental and sinus infections).   Bone and/or joint problems in the neck or jaw.   Depression.   Eye problems.  These headaches are not life threatening.  Headaches can sometimes be diagnosed by a patient history and a physical exam. Sometimes, lab and imaging studies (such as x-ray and/or CT scan) are used to rule out more serious problems. In some cases, a spinal tap (lumbar puncture) may be requested. There are many times when your exam and tests may be normal on the first visit even when there is a serious problem causing your  headaches. Because of that, it is very important to follow up with your doctor or local clinic for further evaluation. FINDING OUT THE RESULTS OF TESTS  If a radiology test was performed, a radiologist will review your results.   You will be contacted by the emergency department or your physician if any test results require a change in your treatment plan.   Not all test results may be available during your visit. If your test results are not back during the visit, make an appointment with your caregiver to find out the results. Do not assume everything is normal if you have not heard from your caregiver or the medical facility. It is important for you to follow up on all of your test results.  HOME CARE INSTRUCTIONS   Keep follow-up appointments with your caregiver, or any specialist referral.   Only take over-the-counter or prescription medicines for pain, discomfort, or fever as directed by your caregiver.   Biofeedback, massage, or other relaxation techniques may be helpful.   Ice packs or heat applied to the head and neck can be used. Do this three to four times per day, or as needed.   Call your doctor if you have any questions or concerns.   If you smoke, you should quit.  SEEK MEDICAL CARE IF:   You develop problems with medications prescribed.   You do not respond to or obtain relief from medications.   You have a change from the usual headache.   You develop nausea or vomiting.  SEEK IMMEDIATE MEDICAL CARE IF:   If your headache becomes severe.   You have an unexplained oral temperature above 102 F (38.9 C), or as your caregiver suggests.   You have a stiff neck.   You have loss of vision.   You have muscular weakness.   You have loss of muscular control.   You develop severe symptoms different from your first symptoms.   You start losing your balance or have trouble walking.   You feel faint or pass out.  MAKE SURE YOU:   Understand these instructions.     Will watch your condition.   Will get help right away if you are not doing well or get worse.  Document Released: 11/17/2005 Document Revised: 11/06/2011 Document Reviewed: 07/06/2008 [facility] Patient Information 2012 [place], [place].

## 2012-03-12 ENCOUNTER — Encounter: Payer: Self-pay | Admitting: Internal Medicine

## 2012-03-16 ENCOUNTER — Other Ambulatory Visit: Payer: Self-pay | Admitting: Internal Medicine

## 2012-03-16 ENCOUNTER — Other Ambulatory Visit: Payer: Self-pay

## 2012-04-27 ENCOUNTER — Telehealth: Payer: Self-pay | Admitting: Internal Medicine

## 2012-04-27 ENCOUNTER — Ambulatory Visit (INDEPENDENT_AMBULATORY_CARE_PROVIDER_SITE_OTHER): Payer: PRIVATE HEALTH INSURANCE | Admitting: Endocrinology

## 2012-04-27 ENCOUNTER — Encounter: Payer: Self-pay | Admitting: Endocrinology

## 2012-04-27 VITALS — BP 122/70 | HR 88 | Temp 97.6°F | Wt 264.0 lb

## 2012-04-27 DIAGNOSIS — L02212 Cutaneous abscess of back [any part, except buttock]: Secondary | ICD-10-CM

## 2012-04-27 DIAGNOSIS — L02219 Cutaneous abscess of trunk, unspecified: Secondary | ICD-10-CM

## 2012-04-27 MED ORDER — DOXYCYCLINE HYCLATE 100 MG PO TABS: 100.0000 mg | ORAL_TABLET | Freq: Two times a day (BID) | ORAL | Status: AC

## 2012-04-27 MED ORDER — HYDROCODONE-ACETAMINOPHEN 10-325 MG PO TABS: 1.0000 | ORAL_TABLET | Freq: Four times a day (QID) | ORAL | Status: AC | PRN

## 2012-04-27 NOTE — Progress Notes (Signed)
Subjective:    Patient ID: Joseph [name], male    DOB: 08-Jun-1961, 51 y.o.   MRN: [id number]  HPI Pt states few days of moderate swelling at the left scapular area, and assoc pain.  Wife says there was a nodule there for "years," but it is much bigger now, and the pain is new.   Past Medical History  Diagnosis Date  . Depression   . GERD (gastroesophageal reflux disease)   . Cerebrovascular accident   . Urinary incontinence   . Cerebral thrombosis with cerebral infarction   . Spastic hemiplegia affecting dominant side   . Retention of urine, unspecified   . Aphasia, late effect of cerebrovascular disease     Past Surgical History  Procedure Date  . Craniotomy     History   Social History  . Marital Status: Legally Separated    Spouse Name: N/A    Number of Children: N/A  . Years of Education: N/A   Occupational History  . Not on file.   Social History Main Topics  . Smoking status: Former Games developer  . Smokeless tobacco: Not on file  . Alcohol Use: No  . Drug Use: No  . Sexually Active: Not Currently   Other Topics Concern  . Not on file   Social History Narrative  . No narrative on file    Current Outpatient Prescriptions on File Prior to Visit  Medication Sig Dispense Refill  . aspirin 81 MG tablet Take 1 tablet (81 mg total) by mouth daily.  30 tablet    . baclofen (LIORESAL) 10 MG tablet Take 10 mg by mouth 3 (three) times daily.      Marland Kitchen desloratadine (CLARINEX) 5 MG tablet Take 1 tablet (5 mg total) by mouth daily.  30 tablet  11  . DULoxetine (CYMBALTA) 60 MG capsule Take 1 capsule (60 mg total) by mouth daily.  30 capsule  2  . fluticasone (FLONASE) 50 MCG/ACT nasal spray Place 1 spray into the nose daily.  16 g  11  . Linaclotide (LINZESS) 290 MCG CAPS Take 1 capsule by mouth daily.      . pantoprazole (PROTONIX) 40 MG tablet Take 1 tablet (40 mg total) by mouth daily.  30 tablet  1  . traMADol (ULTRAM) 50 MG tablet take 1 tablet by mouth once daily  30  tablet  1  . tadalafil (CIALIS) 5 MG tablet Take 1 tablet (5 mg total) by mouth daily as needed for erectile dysfunction.  45 tablet  0  . zolpidem (AMBIEN CR) 12.5 MG CR tablet Take 1 tablet (12.5 mg total) by mouth at bedtime as needed for sleep.  30 tablet  5    Allergies  Allergen Reactions  . Penicillins     REACTION: vomiting    No family history on file.  BP 122/70  Pulse 88  Temp(Src) 97.6 F (36.4 C) (Oral)  Wt 264 lb (119.75 kg)  SpO2 97%  Review of Systems Denies fever.  He has chronic constipation    Objective:   Physical Exam VITAL SIGNS:  See vs page GENERAL: no distress Skin: over teft scapular area, there is 25 mm of fluctuance/tenderness/erythema   procedure: incision/drainage consent signed/pt agrees.  The proper site is identified and verified with the patient.   local 2% xylocaine with epi prep is alcohol prep # 15 blade used to i and d the mass.  purulent material is expressed no complications neosporin and bandaid are applied.  Assessment & Plan:  Abscess, new Sebaceous cyst, chronic Constipation, chronic Insomnia.  Joseph [name] can interact with vicodin

## 2012-04-27 NOTE — Patient Instructions (Addendum)
Change the large bandaid twice a day.  Apply antibiotic ointment before each time. I hope you feel better soon.  If you don't feel better by next week, please call back.  Please call sooner if you get worse. i have sent a prescription to your pharmacy, for an antibiotic pill. Here is a prescription for a pain pill. You should skip the ambien on days you take this. Here are some samples of "linzess," 290 mg.  Take 1 daily, on a trial basis

## 2012-04-27 NOTE — Telephone Encounter (Signed)
Caller: Christine/Caregiver ; PCP: Sanda Linger; CB#: [phone]; ; ; Call regarding Swollen Sore;   Caregiver calling back b/c she was told she would be called back about a possible workin appt for today for red, swollen skin lesion  and hasnt been called yet.  See 04/27/2012  12:43 Triage Note -- RN called office and  was transferred to Harriett Sine who stated no answer yet on whether MD in office can see pt , but she stated she will ask the MD's and will call the Caregiver back -- RN adv Caregiver/ Wynona Canes about the update and to expect a call back.

## 2012-04-27 NOTE — Telephone Encounter (Signed)
PT WILL SEE DR Everardo All AT 3:45 TODAY.

## 2012-04-27 NOTE — Telephone Encounter (Signed)
Caller: Christine/caregiver; PCP: Sanda Linger; CB#: [phone];  Calling today 04/27/12 regarding cyst on left shoulder that has gotten larger.  Area is swollen, hot and red.  Area about the size of a golf ball.  Looks like it is ready to burst.  Afebrile. Emergent symptoms r/o by Skin Lesions guidelines with exception of any skin lesion with signs and symptoms of worsening infection.    No appts available in EPIC.  Contacted office and spoke with Ferdie Ping, who advised Dr. Yetta Barre would be able to lance the area if needed.  She transferred me to [facility] in scheduling who advised that Dr. Yetta Barre has left for today and to send a note in EPIC for a possible work in appt with another MD who would be able to lance area if needed.  OFFICE PLEASE CALL CALLER BACK AT [phone] WITH APPT TIME.  PT NEEDS TO BE SEEN TODAY WITHIN 4 HOURS.

## 2012-04-28 DIAGNOSIS — L02212 Cutaneous abscess of back [any part, except buttock]: Secondary | ICD-10-CM | POA: Insufficient documentation

## 2012-05-17 ENCOUNTER — Other Ambulatory Visit: Payer: Self-pay | Admitting: Internal Medicine

## 2012-06-04 ENCOUNTER — Ambulatory Visit: Payer: Self-pay | Admitting: Internal Medicine

## 2012-06-04 DIAGNOSIS — Z0289 Encounter for other administrative examinations: Secondary | ICD-10-CM

## 2012-06-24 ENCOUNTER — Other Ambulatory Visit: Payer: Self-pay | Admitting: Internal Medicine

## 2012-07-23 ENCOUNTER — Other Ambulatory Visit: Payer: Self-pay | Admitting: Internal Medicine

## 2012-08-11 ENCOUNTER — Other Ambulatory Visit: Payer: Self-pay | Admitting: Internal Medicine

## 2012-08-17 ENCOUNTER — Other Ambulatory Visit: Payer: Self-pay | Admitting: Internal Medicine

## 2012-09-20 ENCOUNTER — Other Ambulatory Visit: Payer: Self-pay | Admitting: Internal Medicine

## 2012-10-04 ENCOUNTER — Other Ambulatory Visit: Payer: Self-pay | Admitting: Internal Medicine

## 2012-11-05 ENCOUNTER — Telehealth: Payer: Self-pay

## 2012-11-05 MED ORDER — TRAMADOL HCL 50 MG PO TABS: 50.0000 mg | ORAL_TABLET | Freq: Two times a day (BID) | ORAL | Status: DC

## 2012-11-05 NOTE — Telephone Encounter (Signed)
ok 

## 2012-11-05 NOTE — Telephone Encounter (Signed)
Received fax from pharmacy stating that pt takes tramadol 50 mg bid. Please advise if ok to update and send new rx Thanks

## 2013-02-26 ENCOUNTER — Other Ambulatory Visit: Payer: Self-pay | Admitting: Internal Medicine

## 2013-02-28 ENCOUNTER — Encounter: Payer: Self-pay | Admitting: Internal Medicine

## 2013-02-28 ENCOUNTER — Ambulatory Visit (INDEPENDENT_AMBULATORY_CARE_PROVIDER_SITE_OTHER): Payer: Self-pay | Admitting: Internal Medicine

## 2013-02-28 ENCOUNTER — Other Ambulatory Visit: Payer: Self-pay | Admitting: Internal Medicine

## 2013-02-28 ENCOUNTER — Other Ambulatory Visit (INDEPENDENT_AMBULATORY_CARE_PROVIDER_SITE_OTHER): Payer: Self-pay

## 2013-02-28 VITALS — BP 118/80 | HR 80 | Temp 98.1°F | Resp 16 | Wt 266.0 lb

## 2013-02-28 DIAGNOSIS — R319 Hematuria, unspecified: Secondary | ICD-10-CM

## 2013-02-28 DIAGNOSIS — R7309 Other abnormal glucose: Secondary | ICD-10-CM

## 2013-02-28 DIAGNOSIS — G4700 Insomnia, unspecified: Secondary | ICD-10-CM

## 2013-02-28 DIAGNOSIS — E781 Pure hyperglyceridemia: Secondary | ICD-10-CM

## 2013-02-28 DIAGNOSIS — N400 Enlarged prostate without lower urinary tract symptoms: Secondary | ICD-10-CM

## 2013-02-28 DIAGNOSIS — Z0000 Encounter for general adult medical examination without abnormal findings: Secondary | ICD-10-CM

## 2013-02-28 DIAGNOSIS — I6350 Cerebral infarction due to unspecified occlusion or stenosis of unspecified cerebral artery: Secondary | ICD-10-CM

## 2013-02-28 DIAGNOSIS — Z23 Encounter for immunization: Secondary | ICD-10-CM | POA: Insufficient documentation

## 2013-02-28 LAB — COMPREHENSIVE METABOLIC PANEL
BUN: 12 mg/dL (ref 6–23)
CO2: 25 mEq/L (ref 19–32)
Creatinine, Ser: 0.9 mg/dL (ref 0.4–1.5)
GFR: 99.7 mL/min (ref >60.00)
Glucose, Bld: 112 mg/dL — ABNORMAL HIGH (ref 70–99)
Total Bilirubin: 0.9 mg/dL (ref 0.3–1.2)
Total Protein: 7.2 g/dL (ref 6.0–8.3)

## 2013-02-28 LAB — URINALYSIS, ROUTINE W REFLEX MICROSCOPIC
Bilirubin Urine: NEGATIVE
Nitrite: NEGATIVE
Total Protein, Urine: NEGATIVE
Urine Glucose: NEGATIVE

## 2013-02-28 LAB — CBC WITH DIFFERENTIAL/PLATELET
Eosinophils Relative: 1.3 % (ref 0.0–5.0)
HCT: 46.8 % (ref 39.0–52.0)
Hemoglobin: 16.1 g/dL (ref 13.0–17.0)
Lymphs Abs: 1.7 10*3/uL (ref 0.7–4.0)
Monocytes Relative: 9.4 % (ref 3.0–12.0)
Platelets: 216 10*3/uL (ref 150.0–400.0)
RBC: 5 Mil/uL (ref 4.22–5.81)
WBC: 6.2 10*3/uL (ref 4.5–10.5)

## 2013-02-28 LAB — LIPID PANEL
HDL: 25.3 mg/dL — ABNORMAL LOW (ref >39.00)
Triglycerides: 210 mg/dL — ABNORMAL HIGH (ref 0.0–149.0)

## 2013-02-28 LAB — TSH: TSH: 0.76 u[IU]/mL (ref 0.35–5.50)

## 2013-02-28 LAB — LDL CHOLESTEROL, DIRECT: Direct LDL: 87.4 mg/dL

## 2013-02-28 MED ORDER — ZOLPIDEM TARTRATE ER 12.5 MG PO TBCR: 12.5000 mg | EXTENDED_RELEASE_TABLET | Freq: Every evening | ORAL | Status: DC | PRN

## 2013-02-28 MED ORDER — TRAMADOL HCL 50 MG PO TABS: 50.0000 mg | ORAL_TABLET | Freq: Two times a day (BID) | ORAL | Status: DC

## 2013-02-28 NOTE — Assessment & Plan Note (Signed)
Exam done Vaccines were updated He was referred for a colonoscopy Labs ordered Pt ed material was given 

## 2013-02-28 NOTE — Assessment & Plan Note (Signed)
He will continue tramadol and ambien as needed

## 2013-02-28 NOTE — Assessment & Plan Note (Signed)
I will recheck his UA today 

## 2013-02-28 NOTE — Assessment & Plan Note (Signed)
He has been stable and somewhat improved since the CVA He takes tramadol for the pain in his RUE and RLE

## 2013-02-28 NOTE — Assessment & Plan Note (Signed)
PSA recheck today

## 2013-02-28 NOTE — Assessment & Plan Note (Signed)
FLP today 

## 2013-02-28 NOTE — Progress Notes (Signed)
Subjective:    Patient ID: Joseph [name], male    DOB: January 18, 1961, 52 y.o.   MRN: [id number]  HPI  He returns for f/up and asks for a refill on tramadol for persistent pain in his right arm and leg s/p CVA several years ago.  Review of Systems  HENT: Negative.   Eyes: Negative.   Respiratory: Negative.  Negative for cough, choking, shortness of breath, wheezing and stridor.   Cardiovascular: Negative.  Negative for chest pain, palpitations and leg swelling.  Gastrointestinal: Negative.  Negative for nausea, vomiting, abdominal pain, diarrhea, constipation and blood in stool.  Endocrine: Negative.   Genitourinary: Negative.   Musculoskeletal: Negative.   Skin: Negative.   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 Negative for dizziness, tremors, seizures, syncope, facial asymmetry, light-headedness, numbness and headaches.  Hematological: Negative.  Negative for adenopathy. Does not bruise/bleed easily.  Psychiatric/Behavioral: Positive for sleep disturbance (DFA). Negative for suicidal ideas, hallucinations, behavioral problems, confusion, self-injury, dysphoric mood, decreased concentration and agitation. The patient is not nervous/anxious and is not hyperactive.        Objective:   Physical Exam  Vitals reviewed. Constitutional: He is oriented to person, place, and time. He appears well-developed and well-nourished. No distress.  HENT:  Head: Normocephalic and atraumatic.  Mouth/Throat: Oropharynx is clear and moist. No oropharyngeal exudate.  Eyes: Conjunctivae are normal. Right eye exhibits no discharge. Left eye exhibits no discharge. No scleral icterus.  Neck: Normal range of motion. Neck supple. No JVD present. No tracheal deviation present. No thyromegaly present.  Cardiovascular: Normal rate, regular rhythm, normal heart sounds and intact distal pulses.  Exam reveals no gallop and no friction  rub.   No murmur heard. Pulmonary/Chest: Effort normal and breath sounds normal. No stridor. No respiratory distress. He has no wheezes. He has no rales. He exhibits no tenderness.  Abdominal: Soft. Bowel sounds are normal. He exhibits no distension and no mass. There is no tenderness. There is no rebound and no guarding. Hernia confirmed negative in the right inguinal area and confirmed negative in the left inguinal area.  Genitourinary: Rectum normal, testes normal and penis normal. Rectal exam shows no external hemorrhoid, no internal hemorrhoid, no fissure, no mass, no tenderness and anal tone normal. Guaiac negative stool. Prostate is enlarged (1+ smooth symm BPH). Prostate is not tender. Right testis shows no mass, no swelling and no tenderness. Right testis is descended. Left testis shows no mass, no swelling and no tenderness. Left testis is descended. Uncircumcised. No phimosis, paraphimosis, hypospadias, penile erythema or penile tenderness. No discharge found.  Musculoskeletal: Normal range of motion. He exhibits no edema and no tenderness.  Lymphadenopathy:    He has no cervical adenopathy.       Right: No inguinal adenopathy present.       Left: No inguinal adenopathy present.  Neurological: He is alert and oriented to person, place, and time. He displays atrophy (rue and rle) and abnormal reflex. He displays no tremor. A sensory deficit is present. No cranial nerve deficit. He exhibits abnormal muscle tone (rue and rle). He displays no seizure activity. Coordination and gait abnormal. He displays Babinski's sign on the right side. He displays no Babinski's sign on the left side.  Reflex Scores:      Tricep reflexes are 3+ on the right side and 1+ on the left side.      Bicep reflexes are 3+ on the right  side and 1+ on the left side.      Brachioradialis reflexes are 3+ on the right side and 1+ on the left side.      Patellar reflexes are 3+ on the right side and 1+ on the left side.       Achilles reflexes are 3+ on the right side and 1+ on the left side. Skin: Skin is warm and dry. No rash noted. He is not diaphoretic. No erythema. No pallor.  Psychiatric: He has a normal mood and affect. His behavior is normal. Judgment and thought content normal.   Lab Results  Component Value Date   WBC 5.4 01/30/2012   HGB 16.3 01/30/2012   HCT 47.7 01/30/2012   PLT 207.0 01/30/2012   GLUCOSE 108* 03/10/2012   CHOL 144 01/30/2012   TRIG 161.0* 01/30/2012   HDL 32.20* 01/30/2012   LDLDIRECT 91.1 12/30/2010   LDLCALC 80 01/30/2012   ALT 27 01/30/2012   AST 21 01/30/2012   NA 138 03/10/2012   K 3.7 03/10/2012   CL 103 03/10/2012   CREATININE 0.8 03/10/2012   BUN 13 03/10/2012   CO2 24 03/10/2012   TSH 0.63 01/30/2012   PSA 0.47 03/10/2012   INR 1.15 12/21/2009   HGBA1C 5.5 03/10/2012      Assessment & Plan:

## 2013-02-28 NOTE — Patient Instructions (Signed)
Health Maintenance, Males A healthy lifestyle and preventative care can promote health and wellness.  Maintain regular health, dental, and eye exams.  Eat a healthy diet. Foods like vegetables, fruits, whole grains, low-fat dairy products, and lean protein foods contain the nutrients you need without too many calories. Decrease your intake of foods high in solid fats, added sugars, and salt. Get information about a proper diet from your caregiver, if necessary.  Regular physical exercise is one of the most important things you can do for your health. Most adults should get at least 150 minutes of moderate-intensity exercise (any activity that increases your heart rate and causes you to sweat) each week. In addition, most adults need muscle-strengthening exercises on 2 or more days a week.   Maintain a healthy weight. The body mass index (BMI) is a screening tool to identify possible weight problems. It provides an estimate of body fat based on height and weight. Your caregiver can help determine your BMI, and can help you achieve or maintain a healthy weight. For adults 20 years and older:  A BMI below 18.5 is considered underweight.  A BMI of 18.5 to 24.9 is normal.  A BMI of 25 to 29.9 is considered overweight.  A BMI of 30 and above is considered obese.  Maintain normal blood lipids and cholesterol by exercising and minimizing your intake of saturated fat. Eat a balanced diet with plenty of fruits and vegetables. Blood tests for lipids and cholesterol should begin at age 20 and be repeated every 5 years. If your lipid or cholesterol levels are high, you are over 50, or you are a high risk for heart disease, you may need your cholesterol levels checked more frequently.Ongoing high lipid and cholesterol levels should be treated with medicines, if diet and exercise are not effective.  If you smoke, find out from your caregiver how to quit. If you do not use tobacco, do not start.  If you  choose to drink alcohol, do not exceed 2 drinks per day. One drink is considered to be 12 ounces (355 mL) of beer, 5 ounces (148 mL) of wine, or 1.5 ounces (44 mL) of liquor.  Avoid use of street drugs. Do not share needles with anyone. Ask for help if you need support or instructions about stopping the use of drugs.  High blood pressure causes heart disease and increases the risk of stroke. Blood pressure should be checked at least every 1 to 2 years. Ongoing high blood pressure should be treated with medicines if weight loss and exercise are not effective.  If you are 45 to 51 years old, ask your caregiver if you should take aspirin to prevent heart disease.  Diabetes screening involves taking a blood sample to check your fasting blood sugar level. This should be done once every 3 years, after age 45, if you are within normal weight and without risk factors for diabetes. Testing should be considered at a younger age or be carried out more frequently if you are overweight and have at least 1 risk factor for diabetes.  Colorectal cancer can be detected and often prevented. Most routine colorectal cancer screening begins at the age of 50 and continues through age 75. However, your caregiver may recommend screening at an earlier age if you have risk factors for colon cancer. On a yearly basis, your caregiver may provide home test kits to check for hidden blood in the stool. Use of a small camera at the end of a tube,   to directly examine the colon (sigmoidoscopy or colonoscopy), can detect the earliest forms of colorectal cancer. Talk to your caregiver about this at age 50, when routine screening begins. Direct examination of the colon should be repeated every 5 to 10 years through age 75, unless early forms of pre-cancerous polyps or small growths are found.  Hepatitis C blood testing is recommended for all people born from 1945 through 1965 and any individual with known risks for hepatitis C.  Healthy  men should no longer receive prostate-specific antigen (PSA) blood tests as part of routine cancer screening. Consult with your caregiver about prostate cancer screening.  Testicular cancer screening is not recommended for adolescents or adult males who have no symptoms. Screening includes self-exam, caregiver exam, and other screening tests. Consult with your caregiver about any symptoms you have or any concerns you have about testicular cancer.  Practice safe sex. Use condoms and avoid high-risk sexual practices to reduce the spread of sexually transmitted infections (STIs).  Use sunscreen with a sun protection factor (SPF) of 30 or greater. Apply sunscreen liberally and repeatedly throughout the day. You should seek shade when your shadow is shorter than you. Protect yourself by wearing long sleeves, pants, a wide-brimmed hat, and sunglasses year round, whenever you are outdoors.  Notify your caregiver of new moles or changes in moles, especially if there is a change in shape or color. Also notify your caregiver if a mole is larger than the size of a pencil eraser.  A one-time screening for abdominal aortic aneurysm (AAA) and surgical repair of large AAAs by sound wave imaging (ultrasonography) is recommended for ages 65 to 75 years who are current or former smokers.  Stay current with your immunizations. Document Released: 05/15/2008 Document Revised: 02/09/2012 Document Reviewed: 04/14/2011 Exit[employer] Patient Information 2013 [employer].  

## 2013-03-01 ENCOUNTER — Encounter: Payer: Self-pay | Admitting: Internal Medicine

## 2013-03-02 ENCOUNTER — Telehealth: Payer: Self-pay | Admitting: Internal Medicine

## 2013-03-02 DIAGNOSIS — I6350 Cerebral infarction due to unspecified occlusion or stenosis of unspecified cerebral artery: Secondary | ICD-10-CM

## 2013-03-02 NOTE — Telephone Encounter (Signed)
Requesting an order for speech therapy.  Can be faxed to [phone].

## 2013-03-02 NOTE — Telephone Encounter (Signed)
done

## 2013-03-23 ENCOUNTER — Telehealth: Payer: Self-pay

## 2013-03-23 DIAGNOSIS — I6350 Cerebral infarction due to unspecified occlusion or stenosis of unspecified cerebral artery: Secondary | ICD-10-CM

## 2013-03-23 DIAGNOSIS — G4700 Insomnia, unspecified: Secondary | ICD-10-CM

## 2013-03-23 MED ORDER — TRAMADOL HCL 50 MG PO TABS: 50.0000 mg | ORAL_TABLET | Freq: Three times a day (TID) | ORAL | Status: DC | PRN

## 2013-03-23 NOTE — Telephone Encounter (Signed)
changed

## 2013-03-23 NOTE — Telephone Encounter (Signed)
Received a fax from [facility] stating you wrote pt's prescription for Tramadol 50 mg BID. Are you sure you want him to take this BID they are verifying? Please advise.

## 2013-03-23 NOTE — Telephone Encounter (Signed)
New script/directions called to [facility] [phone] for Tramadol 50 mg patient will now take 1 tablet every 8 hours as needed for pain

## 2013-03-23 NOTE — Telephone Encounter (Signed)
Or should it be TID. Pt states at times he has been taking up to 3x daily.

## 2013-05-03 ENCOUNTER — Telehealth: Payer: Self-pay

## 2013-05-03 ENCOUNTER — Other Ambulatory Visit: Payer: Self-pay

## 2013-05-03 DIAGNOSIS — I6350 Cerebral infarction due to unspecified occlusion or stenosis of unspecified cerebral artery: Secondary | ICD-10-CM

## 2013-05-03 DIAGNOSIS — G4700 Insomnia, unspecified: Secondary | ICD-10-CM

## 2013-05-03 MED ORDER — TRAMADOL HCL 50 MG PO TABS: 50.0000 mg | ORAL_TABLET | Freq: Four times a day (QID) | ORAL | Status: DC | PRN

## 2013-05-03 MED ORDER — TRAMADOL HCL 50 MG PO TABS: 50.0000 mg | ORAL_TABLET | Freq: Three times a day (TID) | ORAL | Status: DC | PRN

## 2013-05-03 NOTE — Telephone Encounter (Signed)
Joseph [name] w/ [facility] requesting new script for Tramadol QID #120. Please advise if ok. Thanks

## 2013-05-03 NOTE — Telephone Encounter (Signed)
done

## 2013-07-13 ENCOUNTER — Telehealth: Payer: Self-pay | Admitting: *Deleted

## 2013-07-13 NOTE — Telephone Encounter (Signed)
Spoke with pharmacist advised of MDs message 

## 2013-07-13 NOTE — Telephone Encounter (Signed)
Do not fill eary

## 2013-07-13 NOTE — Telephone Encounter (Signed)
Pharmacist called concerned that the pt is requesting an early refill on Tramadol again.  Further states last Rx written on 8.4.14 #120; 7.21.14; 7.4.14.  Please advise

## 2013-07-25 ENCOUNTER — Telehealth: Payer: Self-pay | Admitting: Physical Medicine & Rehabilitation

## 2013-07-25 NOTE — Telephone Encounter (Signed)
Candice with [facility] in [place] called about brace for patient's ankle.  Patient has had the brace for four years and it is worn out and needs a new one.  Please call Candice at [phone].

## 2013-07-25 NOTE — Telephone Encounter (Signed)
Called Urgent care regarding brace request.  Informed them patient has not been seen in some time and last note says we will no longer follow due to consistent no shows and rescheduling.

## 2013-09-07 ENCOUNTER — Other Ambulatory Visit: Payer: Self-pay

## 2013-09-07 DIAGNOSIS — I6350 Cerebral infarction due to unspecified occlusion or stenosis of unspecified cerebral artery: Secondary | ICD-10-CM

## 2013-09-07 DIAGNOSIS — G4700 Insomnia, unspecified: Secondary | ICD-10-CM

## 2013-09-07 DIAGNOSIS — J309 Allergic rhinitis, unspecified: Secondary | ICD-10-CM

## 2013-09-07 MED ORDER — FLUTICASONE PROPIONATE 50 MCG/ACT NA SUSP: 1.0000 | Freq: Every day | NASAL | Status: DC

## 2013-11-06 ENCOUNTER — Other Ambulatory Visit: Payer: Self-pay | Admitting: Internal Medicine

## 2013-11-07 ENCOUNTER — Ambulatory Visit (INDEPENDENT_AMBULATORY_CARE_PROVIDER_SITE_OTHER): Payer: Self-pay | Admitting: Internal Medicine

## 2013-11-07 ENCOUNTER — Encounter: Payer: Self-pay | Admitting: Internal Medicine

## 2013-11-07 VITALS — BP 120/84 | HR 66 | Temp 97.8°F | Resp 16 | Ht 74.0 in | Wt 266.0 lb

## 2013-11-07 DIAGNOSIS — M1711 Unilateral primary osteoarthritis, right knee: Secondary | ICD-10-CM

## 2013-11-07 DIAGNOSIS — I6350 Cerebral infarction due to unspecified occlusion or stenosis of unspecified cerebral artery: Secondary | ICD-10-CM

## 2013-11-07 DIAGNOSIS — M1710 Unilateral primary osteoarthritis, unspecified knee: Secondary | ICD-10-CM

## 2013-11-07 MED ORDER — TRAMADOL HCL 50 MG PO TABS: 50.0000 mg | ORAL_TABLET | Freq: Four times a day (QID) | ORAL | Status: DC | PRN

## 2013-11-07 NOTE — Progress Notes (Signed)
Subjective:    Patient ID: Joseph [name], male    DOB: Sep 04, 1961, 52 y.o.   MRN: [id number]  Arthritis Presents for follow-up visit. The disease course has been fluctuating. The condition has lasted for 2 years. He complains of pain. He reports no stiffness, joint swelling or joint warmth. Affected locations include the right knee. His pain is at a severity of 3/10. Associated symptoms include pain at night and pain while resting. Pertinent negatives include no diarrhea, dry eyes, dry mouth, dysuria, fatigue, fever, rash, Raynaud's syndrome, uveitis or weight loss. His pertinent risk factors include overuse. Past treatments include an opioid. The treatment provided significant relief. Factors aggravating his arthritis include activity.      Review of Systems  Constitutional: Negative for fever, chills, weight loss, diaphoresis, appetite change and fatigue.  HENT: Negative.   Eyes: Negative.   Respiratory: Negative.  Negative for cough, chest tightness, shortness of breath, wheezing and stridor.   Cardiovascular: Negative.  Negative for chest pain, palpitations and leg swelling.  Gastrointestinal: Negative.  Negative for diarrhea.  Endocrine: Negative.   Genitourinary: Negative.  Negative for dysuria.  Musculoskeletal: Positive for arthralgias and arthritis. Negative for back pain, gait problem, joint swelling, myalgias, neck pain, neck stiffness and stiffness.  Skin: Negative.  Negative for color change, pallor, rash and wound.  Allergic/Immunologic: Negative.   Neurological: Positive for facial asymmetry (unchanged from prior CVA), weakness (unchanged in RUE and RLE) and numbness. Negative for dizziness, tremors, seizures, syncope, speech difficulty, light-headedness and headaches.  Hematological: Negative.  Negative for adenopathy. Does not bruise/bleed easily.  Psychiatric/Behavioral: Negative.        Objective:   Physical Exam  Vitals reviewed. Constitutional: He appears  well-developed and well-nourished. No distress.  HENT:  Head: Normocephalic and atraumatic.  Mouth/Throat: Oropharynx is clear and moist. No oropharyngeal exudate.  Eyes: Conjunctivae are normal. Right eye exhibits no discharge. Left eye exhibits no discharge. No scleral icterus.  Neck: Normal range of motion. Neck supple. No JVD present. No tracheal deviation present. No thyromegaly present.  Cardiovascular: Normal rate, regular rhythm, normal heart sounds and intact distal pulses.  Exam reveals no gallop and no friction rub.   No murmur heard. Pulmonary/Chest: Effort normal and breath sounds normal. No stridor. No respiratory distress. He has no wheezes. He has no rales. He exhibits no tenderness.  Abdominal: Soft. Bowel sounds are normal. He exhibits no distension and no mass. There is no tenderness. There is no rebound and no guarding.  Musculoskeletal: Normal range of motion. He exhibits no edema and no tenderness.       Right knee: He exhibits deformity (mild crepitance in the right knee). He exhibits normal range of motion, no swelling, no effusion, no ecchymosis, no laceration, no erythema, normal alignment, no LCL laxity and normal patellar mobility. No tenderness found.  Lymphadenopathy:    He has no cervical adenopathy.  Neurological: He is alert. He displays atrophy. He displays no tremor and normal reflexes. A cranial nerve deficit and sensory deficit is present. He exhibits abnormal muscle tone (RUE and RLE - unchanged). He displays a negative Romberg sign. He displays no seizure activity. Coordination and gait abnormal. He displays Babinski's sign on the right side. He displays no Babinski's sign on the left side.  Reflex Scores:      Tricep reflexes are 2+ on the right side and 1+ on the left side.      Bicep reflexes are 2+ on the right side and 1+ on  the left side.      Brachioradialis reflexes are 2+ on the right side and 1+ on the left side.      Patellar reflexes are 2+ on the  right side and 1+ on the left side.      Achilles reflexes are 2+ on the right side and 1+ on the left side. Skin: Skin is warm and dry. No rash noted. He is not diaphoretic. No erythema. No pallor.  Psychiatric: He has a normal mood and affect. His behavior is normal. Judgment and thought content normal.     Lab Results  Component Value Date   WBC 6.2 02/28/2013   HGB 16.1 02/28/2013   HCT 46.8 02/28/2013   PLT 216.0 02/28/2013   GLUCOSE 112* 02/28/2013   CHOL 142 02/28/2013   TRIG 210.0* 02/28/2013   HDL 25.30* 02/28/2013   LDLDIRECT 87.4 02/28/2013   LDLCALC 80 01/30/2012   ALT 34 02/28/2013   AST 26 02/28/2013   NA 137 02/28/2013   K 3.7 02/28/2013   CL 103 02/28/2013   CREATININE 0.9 02/28/2013   BUN 12 02/28/2013   CO2 25 02/28/2013   TSH 0.76 02/28/2013   PSA 0.64 02/28/2013   INR 1.15 12/21/2009   HGBA1C 5.5 02/28/2013       Assessment & Plan:

## 2013-11-07 NOTE — Patient Instructions (Signed)
Osteoarthritis Osteoarthritis is the most common form of arthritis. It is redness, soreness, and swelling (inflammation) affecting the cartilage. Cartilage acts as a cushion, covering the ends of bones where they meet to form a joint. CAUSES  Over time, the cartilage begins to wear away. This causes bone to rub on bone. This produces pain and stiffness in the affected joints. Factors that contribute to this problem are:  Excessive body weight.  Age.  Overuse of joints. SYMPTOMS   People with osteoarthritis usually experience joint pain, swelling, or stiffness.  Over time, the joint may lose its normal shape.  Small deposits of bone (osteophytes) may grow on the edges of the joint.  Bits of bone or cartilage can break off and float inside the joint space. This may cause more pain and damage.  Osteoarthritis can lead to depression, anxiety, feelings of helplessness, and limitations on daily activities. The most commonly affected joints are in the:  Ends of the fingers.  Thumbs.  Neck.  Lower back.  Knees.  Hips. DIAGNOSIS  Diagnosis is mostly based on your symptoms and exam. Tests may be helpful, including:  X-rays of the affected joint.  A computerized magnetic scan (MRI).  Blood tests to rule out other types of arthritis.  Joint fluid tests. This involves using a needle to draw fluid from the joint and examining the fluid under a microscope. TREATMENT  Goals of treatment are to control pain, improve joint function, maintain a normal body weight, and maintain a healthy lifestyle. Treatment approaches may include:  A prescribed exercise program with rest and joint relief.  Weight control with nutritional education.  Pain relief techniques such as:  Properly applied heat and cold.  Electric pulses delivered to nerve endings under the skin (transcutaneous electrical nerve stimulation, TENS).  Massage.  Certain supplements. Ask your caregiver before using any  supplements, especially in combination with prescribed drugs.  Medicines to control pain, such as:  Acetaminophen.  Nonsteroidal anti-inflammatory drugs (NSAIDs), such as naproxen.  Narcotic or central-acting agents, such as tramadol. This drug carries a risk of addiction and is generally prescribed for short-term use.  Corticosteroids. These can be given orally or as injection. This is a short-term treatment, not recommended for routine use.  Surgery to reposition the bones and relieve pain (osteotomy) or to remove loose pieces of bone and cartilage. Joint replacement may be needed in advanced states of osteoarthritis. HOME CARE INSTRUCTIONS  Your caregiver can recommend specific types of exercise. These may include:  Strengthening exercises. These are done to strengthen the muscles that support joints affected by arthritis. They can be performed with weights or with exercise bands to add resistance.  Aerobic activities. These are exercises, such as brisk walking or low-impact aerobics, that get your heart pumping. They can help keep your lungs and circulatory system in shape.  Range-of-motion activities. These keep your joints limber.  Balance and agility exercises. These help you maintain daily living skills. Learning about your condition and being actively involved in your care will help improve the course of your osteoarthritis. SEEK MEDICAL CARE IF:   You feel hot or your skin turns red.  You develop a rash in addition to your joint pain.  You have an oral temperature above 102 F (38.9 C). FOR MORE INFORMATION  National Institute of Arthritis and Musculoskeletal and Skin Diseases: www.niams.nih.gov National Institute on Aging: www.nia.nih.gov American College of Rheumatology: www.rheumatology.org Document Released: 11/17/2005 Document Revised: 02/09/2012 Document Reviewed: 02/28/2010 Exit[employer] Patient Information 2014 [employer].  

## 2013-11-07 NOTE — Progress Notes (Signed)
Pre visit review using our clinic review tool, if applicable. No additional management support is needed unless otherwise documented below in the visit note. 

## 2013-11-08 ENCOUNTER — Encounter: Payer: Self-pay | Admitting: Internal Medicine

## 2013-11-08 NOTE — Assessment & Plan Note (Signed)
He will continue tramadol as needed for pain

## 2013-11-08 NOTE — Assessment & Plan Note (Signed)
Some improvement noted 

## 2013-11-11 ENCOUNTER — Telehealth: Payer: Self-pay

## 2013-11-11 NOTE — Telephone Encounter (Signed)
The patient came in hoping to get a rx for a medicine called "watson 349" .  He states the Remus Loffler is not helping him sleep.  The only information he could provide was a picture of pill.  The pill is a white long pill stamped "watson 349.    Callback - [phone]  Thanks!

## 2013-11-15 ENCOUNTER — Telehealth: Payer: Self-pay | Admitting: Internal Medicine

## 2013-11-15 NOTE — Telephone Encounter (Signed)
Pt was here last week.  He forgot to ask for pain medicine.  His appointment was canceled this week due our closing.  He says the pain pills are called watson.  He used to take them.  He just keeps saying watson.  Not sure what he is trying to say.

## 2013-11-15 NOTE — Telephone Encounter (Signed)
He is on tramadol for pain, I will not be prescribing anything else Please ask him to come in for a urine drug screen so I can identify what this new pain med is

## 2013-11-17 NOTE — Telephone Encounter (Signed)
LMOM for pt to call for an appt.

## 2013-11-18 ENCOUNTER — Ambulatory Visit: Payer: Self-pay | Admitting: Internal Medicine

## 2014-03-06 ENCOUNTER — Ambulatory Visit: Payer: Self-pay | Admitting: Internal Medicine

## 2014-03-06 DIAGNOSIS — Z0289 Encounter for other administrative examinations: Secondary | ICD-10-CM

## 2014-04-06 ENCOUNTER — Telehealth: Payer: Self-pay | Admitting: Internal Medicine

## 2014-04-06 DIAGNOSIS — M1711 Unilateral primary osteoarthritis, right knee: Secondary | ICD-10-CM

## 2014-04-06 DIAGNOSIS — J309 Allergic rhinitis, unspecified: Secondary | ICD-10-CM

## 2014-04-06 MED ORDER — FLUTICASONE PROPIONATE 50 MCG/ACT NA SUSP: 2.0000 | Freq: Every day | NASAL | Status: DC

## 2014-04-06 MED ORDER — TRAMADOL HCL 50 MG PO TABS: 50.0000 mg | ORAL_TABLET | Freq: Four times a day (QID) | ORAL | Status: DC | PRN

## 2014-04-06 NOTE — Telephone Encounter (Signed)
Patient has appt on Monday but would like a few pills called in of tramadol to get him through until then.  Patient states he can not sleep.

## 2014-04-06 NOTE — Telephone Encounter (Signed)
Patient is requesting rx on tramadol hcl 50 mg and fluticasone nasal spray.

## 2014-04-06 NOTE — Telephone Encounter (Signed)
Per MD will need to schedule appointment, pt aware.

## 2014-04-10 ENCOUNTER — Ambulatory Visit (INDEPENDENT_AMBULATORY_CARE_PROVIDER_SITE_OTHER): Payer: Self-pay | Admitting: Internal Medicine

## 2014-04-10 ENCOUNTER — Encounter: Payer: Self-pay | Admitting: Internal Medicine

## 2014-04-10 ENCOUNTER — Other Ambulatory Visit (INDEPENDENT_AMBULATORY_CARE_PROVIDER_SITE_OTHER): Payer: Self-pay

## 2014-04-10 VITALS — BP 138/72 | HR 80 | Temp 98.5°F | Resp 16 | Ht 74.0 in | Wt 271.2 lb

## 2014-04-10 DIAGNOSIS — R319 Hematuria, unspecified: Secondary | ICD-10-CM

## 2014-04-10 DIAGNOSIS — IMO0002 Reserved for concepts with insufficient information to code with codable children: Secondary | ICD-10-CM

## 2014-04-10 DIAGNOSIS — E781 Pure hyperglyceridemia: Secondary | ICD-10-CM

## 2014-04-10 DIAGNOSIS — M1710 Unilateral primary osteoarthritis, unspecified knee: Secondary | ICD-10-CM

## 2014-04-10 DIAGNOSIS — I6350 Cerebral infarction due to unspecified occlusion or stenosis of unspecified cerebral artery: Secondary | ICD-10-CM

## 2014-04-10 DIAGNOSIS — N400 Enlarged prostate without lower urinary tract symptoms: Secondary | ICD-10-CM

## 2014-04-10 DIAGNOSIS — E785 Hyperlipidemia, unspecified: Secondary | ICD-10-CM | POA: Insufficient documentation

## 2014-04-10 DIAGNOSIS — R7309 Other abnormal glucose: Secondary | ICD-10-CM

## 2014-04-10 DIAGNOSIS — M1711 Unilateral primary osteoarthritis, right knee: Secondary | ICD-10-CM

## 2014-04-10 LAB — URINALYSIS, ROUTINE W REFLEX MICROSCOPIC
BILIRUBIN URINE: NEGATIVE
Ketones, ur: NEGATIVE
LEUKOCYTES UA: NEGATIVE
Nitrite: NEGATIVE
Specific Gravity, Urine: 1.025 (ref 1.000–1.030)
TOTAL PROTEIN, URINE-UPE24: NEGATIVE
Urine Glucose: NEGATIVE
Urobilinogen, UA: 0.2 (ref 0.0–1.0)
pH: 6 (ref 5.0–8.0)

## 2014-04-10 MED ORDER — TRAMADOL HCL 50 MG PO TABS: 50.0000 mg | ORAL_TABLET | Freq: Four times a day (QID) | ORAL | Status: DC | PRN

## 2014-04-10 MED ORDER — TAMSULOSIN HCL 0.4 MG PO CAPS: 0.4000 mg | ORAL_CAPSULE | Freq: Every day | ORAL | Status: AC

## 2014-04-10 NOTE — Progress Notes (Signed)
Pre visit review using our clinic review tool, if applicable. No additional management support is needed unless otherwise documented below in the visit note. 

## 2014-04-10 NOTE — Progress Notes (Signed)
Subjective:    Patient ID: Joseph [name], male    DOB: [birth year], 53 y.o.   MRN: [id number]  Benign Prostatic Hypertrophy This is a recurrent problem. The current episode started more than 1 year ago. The problem has been gradually worsening since onset. Irritative symptoms include frequency. Irritative symptoms do not include nocturia or urgency. Obstructive symptoms include dribbling, a slower stream and straining. Obstructive symptoms do not include incomplete emptying, an intermittent stream or a weak stream. Pertinent negatives include no chills, dysuria, genital pain, hematuria, hesitancy, nausea or vomiting. AUA score is 0-7. He is not sexually active. Nothing aggravates the symptoms. Past treatments include nothing. The treatment provided no relief.      Review of Systems  Constitutional: Negative for fever, chills, diaphoresis, appetite change and fatigue.  HENT: Negative.   Eyes: Negative.   Respiratory: Negative.  Negative for cough, choking, chest tightness, shortness of breath, wheezing and stridor.   Cardiovascular: Negative.  Negative for chest pain, palpitations and leg swelling.  Gastrointestinal: Negative.  Negative for nausea, vomiting, abdominal pain, diarrhea, constipation and blood in stool.  Endocrine: Negative.   Genitourinary: Positive for frequency. Negative for dysuria, hesitancy, urgency, hematuria, incomplete emptying and nocturia.  Musculoskeletal: Positive for arthralgias.       He has diffuse pain in his RUE and RLE, he tells me that he is seeing a neurologist in [place] and he describes getting botox injections in his RUE for the pain. He requests a refill on tramadol for the pain, he tells me that helps quite a bit.  Allergic/Immunologic: Negative.   Neurological: Negative.        He has no new neuro complaints.  Hematological: Negative.  Negative for adenopathy. Does not bruise/bleed easily.  Psychiatric/Behavioral: Negative.        Objective:   Physical Exam  Constitutional: He is oriented to person, place, and time. He appears well-developed and well-nourished. No distress.  HENT:  Head: Normocephalic and atraumatic.  Mouth/Throat: Oropharynx is clear and moist. No oropharyngeal exudate.  Eyes: Conjunctivae are normal. Right eye exhibits no discharge. Left eye exhibits no discharge. No scleral icterus.  Neck: Normal range of motion. Neck supple. No JVD present. No tracheal deviation present. No thyromegaly present.  Cardiovascular: Normal rate, regular rhythm, normal heart sounds and intact distal pulses.  Exam reveals no gallop and no friction rub.   No murmur heard. Pulmonary/Chest: Effort normal and breath sounds normal. No stridor. No respiratory distress. He has no wheezes. He has no rales. He exhibits no tenderness.  Abdominal: Soft. Bowel sounds are normal. He exhibits no distension and no mass. There is no tenderness. There is no rebound and no guarding.  Musculoskeletal: Normal range of motion. He exhibits no edema and no tenderness.  Lymphadenopathy:    He has no cervical adenopathy.  Neurological: He is alert and oriented to person, place, and time. He displays atrophy. He displays no tremor. No cranial nerve deficit. He exhibits abnormal muscle tone (chronic RUE RLE). He displays no seizure activity. Coordination and gait abnormal.  Skin: Skin is warm and dry. No rash noted. He is not diaphoretic. No erythema. No pallor.     Lab Results  Component Value Date   WBC 6.2 02/28/2013   HGB 16.1 02/28/2013   HCT 46.8 02/28/2013   PLT 216.0 02/28/2013   GLUCOSE 112* 02/28/2013   CHOL 142 02/28/2013   TRIG 210.0* 02/28/2013   HDL 25.30* 02/28/2013   LDLDIRECT 87.4 02/28/2013   LDLCALC  80 01/30/2012   ALT 34 02/28/2013   AST 26 02/28/2013   NA 137 02/28/2013   K 3.7 02/28/2013   CL 103 02/28/2013   CREATININE 0.9 02/28/2013   BUN 12 02/28/2013   CO2 25 02/28/2013   TSH 0.76 02/28/2013   PSA 0.64 02/28/2013   INR 1.15 12/21/2009    HGBA1C 5.5 02/28/2013       Assessment & Plan:

## 2014-04-10 NOTE — Patient Instructions (Signed)
Benign Prostatic Hyperplasia An enlarged prostate (benign prostatic hyperplasia) is common in older men. You may experience the following:  Weak urine stream.  Dribbling.  Feeling like the bladder has not emptied completely.  Difficulty starting urination.  Getting up frequently at night to urinate.  Urinating more frequently during the day. HOME CARE INSTRUCTIONS  Monitor your prostatic hyperplasia for any changes. The following actions may help to alleviate any discomfort you are experiencing:  Give yourself time when you urinate.  Stay away from alcohol.  Avoid beverages containing caffeine, such as coffee, tea, and colas, because they can make the problem worse.  Avoid decongestants, antihistamines, and some prescription medicines that can make the problem worse.  Follow up with your health care provider for further treatment as recommended. SEEK MEDICAL CARE IF:  You are experiencing progressive difficulty voiding.  Your urine stream is progressively getting narrower.  You are awaking from sleep with the urge to void more frequently.  You are constantly feeling the need to void.  You experience loss of urine, especially in small amounts. SEEK IMMEDIATE MEDICAL CARE IF:   You develop increased pain with urination or are unable to urinate.  You develop severe abdominal pain, vomiting, a high fever, or fainting.  You develop back pain or blood in your urine. MAKE SURE YOU:   Understand these instructions.  Will watch your condition.  Will get help right away if you are not doing well or get worse. Document Released: 11/17/2005 Document Revised: 07/20/2013 Document Reviewed: 04/19/2013 ExitCare Patient Information 2014 [employer].  

## 2014-04-11 ENCOUNTER — Encounter: Payer: Self-pay | Admitting: Internal Medicine

## 2014-04-11 MED ORDER — ASPIRIN EC 81 MG PO TBEC: 81.0000 mg | DELAYED_RELEASE_TABLET | Freq: Every day | ORAL | Status: AC

## 2014-04-11 NOTE — Assessment & Plan Note (Signed)
Will cont tramadol for the pain Will check his UDS today to see that he is compliant with his meds and to screen for substance abuse

## 2014-04-11 NOTE — Assessment & Plan Note (Signed)
I will check his A1C to see if he has developed DM2 

## 2014-04-11 NOTE — Assessment & Plan Note (Signed)
Start lo-dose aspirin

## 2014-04-11 NOTE — Assessment & Plan Note (Signed)
FLP today 

## 2014-04-11 NOTE — Assessment & Plan Note (Signed)
Will treat the symptoms with flomax

## 2014-05-02 ENCOUNTER — Encounter: Payer: Self-pay | Admitting: Internal Medicine

## 2014-07-13 ENCOUNTER — Ambulatory Visit: Payer: Medicare Other | Admitting: Internal Medicine

## 2014-07-14 ENCOUNTER — Telehealth: Payer: Self-pay | Admitting: Internal Medicine

## 2014-07-14 NOTE — Telephone Encounter (Signed)
Patient no showed for 3 month fu on 07/13/2014.  Please advise.

## 2014-08-09 ENCOUNTER — Ambulatory Visit (INDEPENDENT_AMBULATORY_CARE_PROVIDER_SITE_OTHER): Payer: Medicare Other | Admitting: Internal Medicine

## 2014-08-09 ENCOUNTER — Encounter: Payer: Self-pay | Admitting: Internal Medicine

## 2014-08-09 ENCOUNTER — Other Ambulatory Visit (INDEPENDENT_AMBULATORY_CARE_PROVIDER_SITE_OTHER): Payer: Medicare Other

## 2014-08-09 VITALS — BP 120/80 | HR 74 | Temp 98.1°F | Resp 16 | Ht 74.0 in | Wt 267.8 lb

## 2014-08-09 DIAGNOSIS — R319 Hematuria, unspecified: Secondary | ICD-10-CM

## 2014-08-09 DIAGNOSIS — E781 Pure hyperglyceridemia: Secondary | ICD-10-CM

## 2014-08-09 DIAGNOSIS — E785 Hyperlipidemia, unspecified: Secondary | ICD-10-CM

## 2014-08-09 DIAGNOSIS — G4700 Insomnia, unspecified: Secondary | ICD-10-CM

## 2014-08-09 DIAGNOSIS — Z23 Encounter for immunization: Secondary | ICD-10-CM

## 2014-08-09 DIAGNOSIS — N400 Enlarged prostate without lower urinary tract symptoms: Secondary | ICD-10-CM

## 2014-08-09 DIAGNOSIS — R7309 Other abnormal glucose: Secondary | ICD-10-CM

## 2014-08-09 DIAGNOSIS — M1711 Unilateral primary osteoarthritis, right knee: Secondary | ICD-10-CM

## 2014-08-09 DIAGNOSIS — Z0000 Encounter for general adult medical examination without abnormal findings: Secondary | ICD-10-CM

## 2014-08-09 DIAGNOSIS — I6350 Cerebral infarction due to unspecified occlusion or stenosis of unspecified cerebral artery: Secondary | ICD-10-CM

## 2014-08-09 LAB — PSA: PSA: 0.66 ng/mL (ref 0.10–4.00)

## 2014-08-09 LAB — LIPID PANEL
CHOLESTEROL: 131 mg/dL (ref 0–200)
HDL: 25.7 mg/dL — ABNORMAL LOW (ref >39.00)
LDL Cholesterol: 69 mg/dL (ref 0–99)
NonHDL: 105.3
TRIGLYCERIDES: 182 mg/dL — AB (ref 0.0–149.0)
Total CHOL/HDL Ratio: 5
VLDL: 36.4 mg/dL (ref 0.0–40.0)

## 2014-08-09 LAB — CBC WITH DIFFERENTIAL/PLATELET
BASOS PCT: 0.3 % (ref 0.0–3.0)
Basophils Absolute: 0 10*3/uL (ref 0.0–0.1)
EOS PCT: 1.7 % (ref 0.0–5.0)
Eosinophils Absolute: 0.1 10*3/uL (ref 0.0–0.7)
HCT: 46.1 % (ref 39.0–52.0)
Hemoglobin: 15.8 g/dL (ref 13.0–17.0)
Lymphocytes Relative: 24.1 % (ref 12.0–46.0)
Lymphs Abs: 1.5 10*3/uL (ref 0.7–4.0)
MCHC: 34.3 g/dL (ref 30.0–36.0)
MCV: 94.4 fl (ref 78.0–100.0)
Monocytes Absolute: 0.6 10*3/uL (ref 0.1–1.0)
Monocytes Relative: 10.1 % (ref 3.0–12.0)
NEUTROS PCT: 63.8 % (ref 43.0–77.0)
Neutro Abs: 3.8 10*3/uL (ref 1.4–7.7)
Platelets: 206 10*3/uL (ref 150.0–400.0)
RBC: 4.88 Mil/uL (ref 4.22–5.81)
RDW: 12.8 % (ref 11.5–15.5)
WBC: 6 10*3/uL (ref 4.0–10.5)

## 2014-08-09 LAB — URINALYSIS, ROUTINE W REFLEX MICROSCOPIC
BILIRUBIN URINE: NEGATIVE
KETONES UR: NEGATIVE
LEUKOCYTES UA: NEGATIVE
Nitrite: NEGATIVE
SPECIFIC GRAVITY, URINE: 1.015 (ref 1.000–1.030)
Total Protein, Urine: NEGATIVE
UROBILINOGEN UA: 1 (ref 0.0–1.0)
Urine Glucose: NEGATIVE
pH: 7 (ref 5.0–8.0)

## 2014-08-09 LAB — COMPREHENSIVE METABOLIC PANEL
ALBUMIN: 4.1 g/dL (ref 3.5–5.2)
ALT: 33 U/L (ref 0–53)
AST: 27 U/L (ref 0–37)
Alkaline Phosphatase: 60 U/L (ref 39–117)
BUN: 9 mg/dL (ref 6–23)
CHLORIDE: 106 meq/L (ref 96–112)
CO2: 23 mEq/L (ref 19–32)
Calcium: 9 mg/dL (ref 8.4–10.5)
Creatinine, Ser: 0.9 mg/dL (ref 0.4–1.5)
GFR: 97.82 mL/min (ref >60.00)
GLUCOSE: 103 mg/dL — AB (ref 70–99)
POTASSIUM: 4 meq/L (ref 3.5–5.1)
Sodium: 138 mEq/L (ref 135–145)
Total Bilirubin: 0.6 mg/dL (ref 0.2–1.2)
Total Protein: 7.4 g/dL (ref 6.0–8.3)

## 2014-08-09 LAB — TSH: TSH: 0.78 u[IU]/mL (ref 0.35–4.50)

## 2014-08-09 LAB — HEMOGLOBIN A1C: Hgb A1c MFr Bld: 5.7 % (ref 4.6–6.5)

## 2014-08-09 MED ORDER — TRAMADOL HCL 50 MG PO TABS: 50.0000 mg | ORAL_TABLET | Freq: Four times a day (QID) | ORAL | Status: DC | PRN

## 2014-08-09 MED ORDER — ZOLPIDEM TARTRATE 10 MG PO TABS: 10.0000 mg | ORAL_TABLET | Freq: Every evening | ORAL | Status: AC | PRN

## 2014-08-09 NOTE — Patient Instructions (Signed)
Benign Prostatic Hyperplasia An enlarged prostate (benign prostatic hyperplasia) is common in older men. You may experience the following:  Weak urine stream.  Dribbling.  Feeling like the bladder has not emptied completely.  Difficulty starting urination.  Getting up frequently at night to urinate.  Urinating more frequently during the day. HOME CARE INSTRUCTIONS  Monitor your prostatic hyperplasia for any changes. The following actions may help to alleviate any discomfort you are experiencing:  Give yourself time when you urinate.  Stay away from alcohol.  Avoid beverages containing caffeine, such as coffee, tea, and colas, because they can make the problem worse.  Avoid decongestants, antihistamines, and some prescription medicines that can make the problem worse.  Follow up with your health care provider for further treatment as recommended. SEEK MEDICAL CARE IF:  You are experiencing progressive difficulty voiding.  Your urine stream is progressively getting narrower.  You are awaking from sleep with the urge to void more frequently.  You are constantly feeling the need to void.  You experience loss of urine, especially in small amounts. SEEK IMMEDIATE MEDICAL CARE IF:   You develop increased pain with urination or are unable to urinate.  You develop severe abdominal pain, vomiting, a high fever, or fainting.  You develop back pain or blood in your urine. MAKE SURE YOU:   Understand these instructions.  Will watch your condition.  Will get help right away if you are not doing well or get worse. Document Released: 11/17/2005 Document Revised: 07/20/2013 Document Reviewed: 04/19/2013 ExitCare Patient Information 2015 [employer]. This information is not intended to replace advice given to you by your health care provider. Make sure you discuss any questions you have with your health care provider.  

## 2014-08-09 NOTE — Progress Notes (Signed)
Subjective:    Patient ID: Joseph [name], male    DOB: 17-Dec-1960, 53 y.o.   MRN: [id number]  Benign Prostatic Hypertrophy This is a recurrent problem. The current episode started more than 1 year ago. The problem has been gradually worsening since onset. Irritative symptoms include frequency and nocturia. Irritative symptoms do not include urgency. Obstructive symptoms include dribbling, incomplete emptying, a slower stream and a weak stream. Obstructive symptoms do not include an intermittent stream or straining. Pertinent negatives include no chills, dysuria, genital pain, hematuria, hesitancy, nausea or vomiting. AUA score is 8-19. He is not sexually active. Nothing aggravates the symptoms. Past treatments include tamsulosin. The treatment provided moderate relief.      Review of Systems  Constitutional: Negative for fever, chills, diaphoresis and fatigue.  HENT: Negative.   Eyes: Negative.   Respiratory: Negative.  Negative for cough, choking, chest tightness, shortness of breath and stridor.   Cardiovascular: Negative.  Negative for chest pain, palpitations and leg swelling.  Gastrointestinal: Negative.  Negative for nausea, vomiting and abdominal pain.  Endocrine: Negative.   Genitourinary: Positive for frequency, incomplete emptying and nocturia. Negative for dysuria, hesitancy, urgency and hematuria.  Musculoskeletal: Positive for arthralgias and gait problem. Negative for back pain, joint swelling, myalgias, neck pain and neck stiffness.  Skin: Negative.   Allergic/Immunologic: Negative.   Neurological: Positive for weakness and numbness. Negative for dizziness, tremors, seizures, syncope, facial asymmetry, speech difficulty, light-headedness and headaches.  Hematological: Negative.  Negative for adenopathy. Does not bruise/bleed easily.  Psychiatric/Behavioral: Negative.        Objective:   Physical Exam  Vitals reviewed. Constitutional: He is oriented to person, place,  and time. He appears well-developed and well-nourished. No distress.  HENT:  Head: Normocephalic and atraumatic.  Mouth/Throat: Oropharynx is clear and moist. No oropharyngeal exudate.  Eyes: Conjunctivae are normal. Right eye exhibits no discharge. Left eye exhibits no discharge. No scleral icterus.  Neck: Normal range of motion. Neck supple. No JVD present. No tracheal deviation present. No thyromegaly present.  Cardiovascular: Normal rate, regular rhythm, normal heart sounds and intact distal pulses.  Exam reveals no gallop and no friction rub.   No murmur heard. Pulmonary/Chest: Effort normal and breath sounds normal. No stridor. No respiratory distress. He has no wheezes. He has no rales. He exhibits no tenderness.  Abdominal: Soft. Bowel sounds are normal. He exhibits no distension and no mass. There is no tenderness. There is no rebound and no guarding. Hernia confirmed negative in the right inguinal area and confirmed negative in the left inguinal area.  Genitourinary: Rectum normal, testes normal and penis normal. Rectal exam shows no external hemorrhoid, no internal hemorrhoid, no fissure, no mass, no tenderness and anal tone normal. Guaiac negative stool. Prostate is enlarged (2+ smooth symm BPH). Prostate is not tender. Right testis shows no mass, no swelling and no tenderness. Right testis is descended. Left testis shows no mass, no swelling and no tenderness. Left testis is descended. Uncircumcised. No phimosis, paraphimosis, hypospadias, penile erythema or penile tenderness. No discharge found.  Musculoskeletal: Normal range of motion. He exhibits no edema and no tenderness.  Lymphadenopathy:    He has no cervical adenopathy.       Right: No inguinal adenopathy present.       Left: No inguinal adenopathy present.  Neurological: He is oriented to person, place, and time.  Skin: Skin is warm and dry. No rash noted. He is not diaphoretic. No erythema. No pallor.  Psychiatric: He  has a  normal mood and affect. His behavior is normal. Judgment and thought content normal.      Lab Results  Component Value Date   WBC 6.2 02/28/2013   HGB 16.1 02/28/2013   HCT 46.8 02/28/2013   PLT 216.0 02/28/2013   GLUCOSE 112* 02/28/2013   CHOL 142 02/28/2013   TRIG 210.0* 02/28/2013   HDL 25.30* 02/28/2013   LDLDIRECT 87.4 02/28/2013   LDLCALC 80 01/30/2012   ALT 34 02/28/2013   AST 26 02/28/2013   NA 137 02/28/2013   K 3.7 02/28/2013   CL 103 02/28/2013   CREATININE 0.9 02/28/2013   BUN 12 02/28/2013   CO2 25 02/28/2013   TSH 0.76 02/28/2013   PSA 0.64 02/28/2013   INR 1.15 12/21/2009   HGBA1C 5.5 02/28/2013      Assessment & Plan:

## 2014-08-09 NOTE — Progress Notes (Signed)
Pre visit review using our clinic review tool, if applicable. No additional management support is needed unless otherwise documented below in the visit note. 

## 2014-08-10 ENCOUNTER — Encounter: Payer: Self-pay | Admitting: Internal Medicine

## 2014-08-12 NOTE — Assessment & Plan Note (Signed)
Urology referral

## 2014-08-12 NOTE — Assessment & Plan Note (Signed)
Improvement noted No treatment indicated

## 2014-08-12 NOTE — Assessment & Plan Note (Signed)
He will cont tramadol as needed for pain

## 2014-08-12 NOTE — Assessment & Plan Note (Signed)
I have asked him to see urology for further evaluation

## 2014-08-12 NOTE — Assessment & Plan Note (Signed)
Exam done Vaccines were updted Labs ordered He was referred for a colonoscopy Pt ed material was given

## 2014-08-12 NOTE — Assessment & Plan Note (Signed)
Some improvement in his overall function noted today

## 2014-08-22 ENCOUNTER — Encounter: Payer: Self-pay | Admitting: Internal Medicine

## 2014-09-06 ENCOUNTER — Encounter: Payer: Self-pay | Admitting: Internal Medicine

## 2014-11-29 ENCOUNTER — Other Ambulatory Visit: Payer: Self-pay | Admitting: Internal Medicine

## 2014-11-29 ENCOUNTER — Other Ambulatory Visit: Payer: Self-pay | Admitting: *Deleted

## 2014-11-29 DIAGNOSIS — M1711 Unilateral primary osteoarthritis, right knee: Secondary | ICD-10-CM

## 2014-11-29 MED ORDER — TRAMADOL HCL 50 MG PO TABS: 50.0000 mg | ORAL_TABLET | Freq: Four times a day (QID) | ORAL | Status: DC | PRN

## 2014-12-04 ENCOUNTER — Other Ambulatory Visit: Payer: Self-pay | Admitting: Internal Medicine

## 2015-02-21 ENCOUNTER — Ambulatory Visit: Payer: Medicare Other | Admitting: Internal Medicine

## 2015-02-22 ENCOUNTER — Encounter: Payer: Self-pay | Admitting: Family

## 2015-02-22 ENCOUNTER — Telehealth: Payer: Self-pay

## 2015-02-22 ENCOUNTER — Ambulatory Visit (INDEPENDENT_AMBULATORY_CARE_PROVIDER_SITE_OTHER): Payer: Medicare Other | Admitting: Family

## 2015-02-22 VITALS — BP 130/82 | HR 97 | Temp 98.1°F | Resp 18 | Ht 74.0 in | Wt 274.0 lb

## 2015-02-22 DIAGNOSIS — S2231XD Fracture of one rib, right side, subsequent encounter for fracture with routine healing: Secondary | ICD-10-CM | POA: Diagnosis not present

## 2015-02-22 DIAGNOSIS — S2239XA Fracture of one rib, unspecified side, initial encounter for closed fracture: Secondary | ICD-10-CM | POA: Insufficient documentation

## 2015-02-22 MED ORDER — OXYCODONE-ACETAMINOPHEN 5-325 MG PO TABS: 1.0000 | ORAL_TABLET | Freq: Four times a day (QID) | ORAL | Status: DC | PRN

## 2015-02-22 NOTE — Progress Notes (Signed)
   Subjective:    Patient ID: Joseph [name], male    DOB: January 01, 1961, 54 y.o.   MRN: [id number]  Chief Complaint  Patient presents with  . Chest Pain    Rib fractures    HPI:  Joseph [name] is a 54 y.o. male with a history of CVA, right-sided hemiplegia and aphasia who presents today for follow up.   Associated symptoms of pain located in his right ribs started about 11 days ago when he experienced a fall at home while taking down pictures and landed on his right chest resulting in 3 fractured ribs which was confirmed on his visit to [facility]. He was placed on ibuprofen and percocet. Pain is described as "hurting so bad." [facility] from [facility] were reviewed in detail.   Allergies  Allergen Reactions  . Penicillins     REACTION: vomiting    Current Outpatient Prescriptions on File Prior to Visit  Medication Sig Dispense Refill  . aspirin EC 81 MG tablet Take 1 tablet (81 mg total) by mouth daily. 90 tablet 3  . fluticasone (FLONASE) 50 MCG/ACT nasal spray Place 2 sprays into both nostrils daily. 16 g 11  . tamsulosin (FLOMAX) 0.4 MG CAPS capsule Take 1 capsule (0.4 mg total) by mouth daily. 90 capsule 3  . traMADol (ULTRAM) 50 MG tablet Take 1 tablet (50 mg total) by mouth every 6 (six) hours as needed. 30 tablet 0   No current facility-administered medications on file prior to visit.    Review of Systems  Constitutional: Negative for fever and chills.  Respiratory: Negative for chest tightness and shortness of breath.   Musculoskeletal:       Positive for rib pain.       Objective:    BP 130/82 mmHg  Pulse 97  Temp(Src) 98.1 F (36.7 C) (Oral)  Resp 18  Ht 6\' 2"  (1.88 m)  Wt 274 lb (124.286 kg)  BMI 35.16 kg/m2  SpO2 97% Nursing note and vital signs reviewed.  Physical Exam  Constitutional: He is oriented to person, place, and time. He appears well-developed and well-nourished. No distress.  Cardiovascular: Normal rate, regular rhythm, normal heart  sounds and intact distal pulses.   Pulmonary/Chest: Effort normal and breath sounds normal.  Musculoskeletal:  Limited exam secondary to pain. Tenderness elicited on right lower ribs. No distinct crepitus noted.   Neurological: He is alert and oriented to person, place, and time.  Skin: Skin is warm and dry.  Psychiatric: He has a normal mood and affect. His behavior is normal. Judgment and thought content normal.       Assessment & Plan:

## 2015-02-22 NOTE — Assessment & Plan Note (Addendum)
Previously diagnosed rib fracture from a fall. Refill oxycodone-acetaminophen for pain control. Hold tramadol at this time. Discussed rib splinting and potential ace wrap usage to help relieve pain. Instructed to continue working on deep breaths to prevent development of PNA. Follow up in 2 weeks or sooner if symptoms worsen or shortness of breath develops or worsens.

## 2015-02-22 NOTE — Patient Instructions (Addendum)
Thank you for choosing [facility].  Summary/Instructions:  Your prescription(s) have been submitted to your pharmacy or been printed and provided for you. Please take as directed and contact our office if you believe you are having problem(s) with the medication(s) or have any questions.  If your symptoms worsen or fail to improve, please contact our office for further instruction, or in case of emergency go directly to the emergency room at the closest medical facility.   Rib Fracture A rib fracture is a break or crack in one of the bones of the ribs. The ribs are a group of long, curved bones that wrap around your chest and attach to your spine. They protect your lungs and other organs in the chest cavity. A broken or cracked rib is often painful, but most do not cause other problems. Most rib fractures heal on their own over time. However, rib fractures can be more serious if multiple ribs are broken or if broken ribs move out of place and push against other structures. CAUSES   A direct blow to the chest. For example, this could happen during contact sports, a car accident, or a fall against a hard object.  Repetitive movements with high force, such as pitching a baseball or having severe coughing spells. SYMPTOMS   Pain when you breathe in or cough.  Pain when someone presses on the injured area. DIAGNOSIS  Your caregiver will perform a physical exam. Various imaging tests may be ordered to confirm the diagnosis and to look for related injuries. These tests may include a chest X-ray, computed tomography (CT), magnetic resonance imaging (MRI), or a bone scan. TREATMENT  Rib fractures usually heal on their own in 1-3 months. The longer healing period is often associated with a continued cough or other aggravating activities. During the healing period, pain control is very important. Medication is usually given to control pain. Hospitalization or surgery may be needed for more severe  injuries, such as those in which multiple ribs are broken or the ribs have moved out of place.  HOME CARE INSTRUCTIONS   Avoid strenuous activity and any activities or movements that cause pain. Be careful during activities and avoid bumping the injured rib.  Gradually increase activity as directed by your caregiver.  Only take over-the-counter or prescription medications as directed by your caregiver. Do not take other medications without asking your caregiver first.  Apply ice to the injured area for the first 1-2 days after you have been treated or as directed by your caregiver. Applying ice helps to reduce inflammation and pain.  Put ice in a plastic bag.  Place a towel between your skin and the bag.   Leave the ice on for 15-20 minutes at a time, every 2 hours while you are awake.  Perform deep breathing as directed by your caregiver. This will help prevent pneumonia, which is a common complication of a broken rib. Your caregiver may instruct you to:  Take deep breaths several times a day.  Try to cough several times a day, holding a pillow against the injured area.  Use a device called an incentive spirometer to practice deep breathing several times a day.  Drink enough fluids to keep your urine clear or pale yellow. This will help you avoid constipation.   Do not wear a rib belt or binder. These restrict breathing, which can lead to pneumonia.  SEEK IMMEDIATE MEDICAL CARE IF:   You have a fever.   You have difficulty breathing  or shortness of breath.   You develop a continual cough, or you cough up thick or bloody sputum.  You feel sick to your stomach (nausea), throw up (vomit), or have abdominal pain.   You have worsening pain not controlled with medications.  MAKE SURE YOU:  Understand these instructions.  Will watch your condition.  Will get help right away if you are not doing well or get worse. Document Released: 11/17/2005 Document Revised:  07/20/2013 Document Reviewed: 01/19/2013 [facility] Patient Information 2015 [place]. This information is not intended to replace advice given to you by your health care provider. Make sure you discuss any questions you have with your health care provider.

## 2015-02-22 NOTE — Progress Notes (Signed)
Pre visit review using our clinic review tool, if applicable. No additional management support is needed unless otherwise documented below in the visit note. 

## 2015-03-06 ENCOUNTER — Ambulatory Visit (INDEPENDENT_AMBULATORY_CARE_PROVIDER_SITE_OTHER): Payer: Medicare Other | Admitting: Internal Medicine

## 2015-03-06 ENCOUNTER — Encounter: Payer: Self-pay | Admitting: Internal Medicine

## 2015-03-06 ENCOUNTER — Other Ambulatory Visit: Payer: Medicare Other

## 2015-03-06 ENCOUNTER — Ambulatory Visit (INDEPENDENT_AMBULATORY_CARE_PROVIDER_SITE_OTHER)
Admission: RE | Admit: 2015-03-06 | Discharge: 2015-03-06 | Disposition: A | Discharge status: Home / Self Care | Payer: Medicare Other | Source: Ambulatory Visit | Attending: Internal Medicine | Admitting: Internal Medicine

## 2015-03-06 VITALS — BP 140/78 | HR 77 | Temp 98.0°F | Resp 18 | Wt 272.0 lb

## 2015-03-06 DIAGNOSIS — M1711 Unilateral primary osteoarthritis, right knee: Secondary | ICD-10-CM

## 2015-03-06 DIAGNOSIS — I6350 Cerebral infarction due to unspecified occlusion or stenosis of unspecified cerebral artery: Secondary | ICD-10-CM

## 2015-03-06 DIAGNOSIS — M179 Osteoarthritis of knee, unspecified: Secondary | ICD-10-CM

## 2015-03-06 DIAGNOSIS — S2231XS Fracture of one rib, right side, sequela: Secondary | ICD-10-CM | POA: Diagnosis not present

## 2015-03-06 DIAGNOSIS — I639 Cerebral infarction, unspecified: Secondary | ICD-10-CM

## 2015-03-06 MED ORDER — TRAMADOL HCL 50 MG PO TABS: 50.0000 mg | ORAL_TABLET | Freq: Two times a day (BID) | ORAL | Status: DC

## 2015-03-06 MED ORDER — OXYCODONE-ACETAMINOPHEN 5-325 MG PO TABS: 1.0000 | ORAL_TABLET | Freq: Four times a day (QID) | ORAL | Status: DC | PRN

## 2015-03-06 NOTE — Patient Instructions (Signed)

## 2015-03-06 NOTE — Progress Notes (Signed)
Pre visit review using our clinic review tool, if applicable. No additional management support is needed unless otherwise documented below in the visit note. 

## 2015-03-07 NOTE — Assessment & Plan Note (Signed)
Will continue tramadol as needed for pain

## 2015-03-07 NOTE — Progress Notes (Signed)
Subjective:    Patient ID: Joseph [name], male    DOB: 15-Mar-1961, 54 y.o.   MRN: [id number]  Chest Pain  This is a recurrent problem. The current episode started 1 to 4 weeks ago. The onset quality is sudden. The problem occurs intermittently. The problem has been unchanged. Pain location: diffuse right chest wall area. The pain is at a severity of 4/10. The pain is moderate. The quality of the pain is described as sharp and stabbing. The pain does not radiate. Pertinent negatives include no abdominal pain, back pain, claudication, cough, diaphoresis, dizziness, exertional chest pressure, fever, headaches, hemoptysis, irregular heartbeat, leg pain, lower extremity edema, malaise/fatigue, nausea, near-syncope, numbness, orthopnea, palpitations, PND, shortness of breath, sputum production, syncope, vomiting or weakness. The pain is aggravated by movement. He has tried analgesics for the symptoms. The treatment provided moderate relief. Prior workup: he tells me that he had a CXR done elsewhere that showed 3 right rib fractures.      Review of Systems  Constitutional: Negative.  Negative for fever, chills, malaise/fatigue, diaphoresis, appetite change and fatigue.  HENT: Negative.   Eyes: Negative.   Respiratory: Negative.  Negative for cough, hemoptysis, sputum production, choking, chest tightness, shortness of breath and stridor.   Cardiovascular: Positive for chest pain. Negative for palpitations, orthopnea, claudication, syncope, PND and near-syncope.  Gastrointestinal: Negative.  Negative for nausea, vomiting and abdominal pain.  Endocrine: Negative.   Genitourinary: Negative.  Negative for hematuria, flank pain and difficulty urinating.  Musculoskeletal: Positive for myalgias (diffuse pain in RUE and RLE) and arthralgias. Negative for back pain.  Skin: Negative.   Allergic/Immunologic: Negative.   Neurological: Negative.  Negative for dizziness, tremors, weakness, numbness and  headaches.  Hematological: Negative.  Negative for adenopathy. Does not bruise/bleed easily.  Psychiatric/Behavioral: Negative.        Objective:   Physical Exam  Constitutional: He is oriented to person, place, and time. He appears well-developed and well-nourished.  Non-toxic appearance. He does not have a sickly appearance. He does not appear ill. No distress.  HENT:  Head: Normocephalic and atraumatic.  Mouth/Throat: Oropharynx is clear and moist. No oropharyngeal exudate.  Eyes: Conjunctivae are normal. Right eye exhibits no discharge. Left eye exhibits no discharge. No scleral icterus.  Neck: Normal range of motion. Neck supple. No JVD present. No tracheal deviation present. No thyromegaly present.  Cardiovascular: Normal rate, regular rhythm and intact distal pulses.  Exam reveals no gallop and no friction rub.   No murmur heard. Pulmonary/Chest: Effort normal and breath sounds normal. No accessory muscle usage or stridor. No respiratory distress. He has no decreased breath sounds. He has no wheezes. He has no rhonchi. He has no rales. Chest wall is not dull to percussion. He exhibits tenderness and bony tenderness. He exhibits no mass, no laceration, no crepitus, no edema, no deformity, no swelling and no retraction.    Abdominal: Soft. Bowel sounds are normal. He exhibits no distension and no mass. There is no tenderness. There is no rebound and no guarding.  Musculoskeletal: Normal range of motion. He exhibits no edema or tenderness.  Lymphadenopathy:    He has no cervical adenopathy.  Neurological: He is oriented to person, place, and time.  Skin: Skin is warm and dry. No rash noted. He is not diaphoretic. No erythema. No pallor.  Psychiatric: He has a normal mood and affect. His behavior is normal. Judgment and thought content normal.  Vitals reviewed.    Dg Chest 2 View  03/07/2015   CLINICAL DATA:  Follow-up of right rib fractures  EXAM: CHEST  2 VIEW  COMPARISON:   Portable chest x-ray of December 26, 2009.  FINDINGS: The lungs are adequately inflated. There is no focal infiltrate, pleural effusion, pneumothorax, or pneumomediastinum. The heart and pulmonary vascularity are normal. The mediastinum is normal in width. There is mild multilevel degenerative disc space narrowing of the thoracic spine. No displaced rib fracture is demonstrated.  IMPRESSION: There is no active cardiopulmonary disease. No rib fracture is demonstrated.   Electronically Signed   By: David  [place]   On: 03/07/2015 08:41       Assessment & Plan:

## 2015-03-07 NOTE — Assessment & Plan Note (Signed)
No changes noted in his clinical condition

## 2015-03-07 NOTE — Assessment & Plan Note (Signed)
Xray today is negative for a rib fracture He was given 90 doses of percocet during his last visit here but he tells me that someone stole them so I gave him another Rx for a brief supply I am now concerned that he has been dishonest with me about the rib fractures so will not prescribe any more percocet for him Will send him a letter with this info

## 2015-03-09 ENCOUNTER — Telehealth: Payer: Self-pay | Admitting: Internal Medicine

## 2015-03-09 NOTE — Telephone Encounter (Signed)
Denied, pt was just seen 03/06/15 and too soon to be out. PCP is out of the office until and will not ask any partners to refill.

## 2015-03-09 NOTE — Telephone Encounter (Signed)
Patient states he is out of meds for pain and for sleep for his three broke ribs that he received on his last visit.  Patient uses [facility] in [place].  Patient is requesting a call back in regards.

## 2015-03-14 ENCOUNTER — Telehealth: Payer: Self-pay | Admitting: Internal Medicine

## 2015-03-14 NOTE — Telephone Encounter (Signed)
--  This is Joseph MoosDustin [name]-- Patient came in around 5:00 today to advise that he is out of tramadol. He wanted to lave a [facility]. I advised that I would send a note instead.   He states that the 60 qty tramadol was not enough. I spoke with [place] as well. He wanted you to simply see the encounter notes from 02/22/2015. I advised him that you had full access to his record, and he insists that you are unaware of this visit.

## 2015-03-15 ENCOUNTER — Encounter: Payer: Self-pay | Admitting: Internal Medicine

## 2015-03-15 NOTE — Telephone Encounter (Signed)
Called patient, no answer and did not leave a personal VM//discard letter to follow.

## 2015-03-15 NOTE — Telephone Encounter (Signed)
Let him know that I will be sending him a letter to discharge him from the practice

## 2015-03-20 ENCOUNTER — Telehealth: Payer: Self-pay | Admitting: Internal Medicine

## 2015-03-20 NOTE — Telephone Encounter (Signed)
Patient dismissed from [facility] by Sanda Lingerhomas Jones, M.D. effective March 15, 2015. Dismissal letter sent out by certified / registered mail on March 20, 2015. [facility]

## 2015-03-23 NOTE — Telephone Encounter (Signed)
Received signed domestic return receipt on March 23, 2015 verifying delivery of certified letter on March 22, 2015. Article number [id number] [phone]. [facility]

## 2015-04-04 ENCOUNTER — Encounter: Payer: Self-pay | Admitting: Internal Medicine

## 2015-04-11 ENCOUNTER — Other Ambulatory Visit: Payer: Self-pay | Admitting: Internal Medicine

## 2015-06-01 NOTE — Telephone Encounter (Signed)
error 

## 2015-07-05 ENCOUNTER — Other Ambulatory Visit: Payer: Self-pay | Admitting: Internal Medicine

## 2015-07-17 ENCOUNTER — Other Ambulatory Visit: Payer: Self-pay | Admitting: Internal Medicine

## 2015-07-31 ENCOUNTER — Other Ambulatory Visit: Payer: Self-pay | Admitting: Internal Medicine

## 2018-07-05 ENCOUNTER — Emergency Department (HOSPITAL_COMMUNITY): Payer: Medicare Other

## 2018-07-05 ENCOUNTER — Inpatient Hospital Stay (HOSPITAL_COMMUNITY)
Admission: EM | Admit: 2018-07-05 | Discharge: 2018-07-10 | DRG: 602 | Disposition: A | Discharge status: Skilled Nursing Facility | Payer: Medicare Other | Attending: Internal Medicine | Admitting: Internal Medicine

## 2018-07-05 ENCOUNTER — Inpatient Hospital Stay (HOSPITAL_COMMUNITY): Payer: Medicare Other

## 2018-07-05 ENCOUNTER — Other Ambulatory Visit: Payer: Self-pay

## 2018-07-05 ENCOUNTER — Encounter (HOSPITAL_COMMUNITY): Payer: Self-pay | Admitting: Emergency Medicine

## 2018-07-05 ENCOUNTER — Observation Stay (HOSPITAL_COMMUNITY): Payer: Medicare Other

## 2018-07-05 DIAGNOSIS — G9341 Metabolic encephalopathy: Secondary | ICD-10-CM | POA: Diagnosis present

## 2018-07-05 DIAGNOSIS — L03115 Cellulitis of right lower limb: Secondary | ICD-10-CM | POA: Diagnosis present

## 2018-07-05 DIAGNOSIS — I361 Nonrheumatic tricuspid (valve) insufficiency: Secondary | ICD-10-CM | POA: Diagnosis not present

## 2018-07-05 DIAGNOSIS — G9340 Encephalopathy, unspecified: Secondary | ICD-10-CM | POA: Diagnosis not present

## 2018-07-05 DIAGNOSIS — I6930 Unspecified sequelae of cerebral infarction: Secondary | ICD-10-CM | POA: Diagnosis not present

## 2018-07-05 DIAGNOSIS — Z6834 Body mass index (BMI) 34.0-34.9, adult: Secondary | ICD-10-CM

## 2018-07-05 DIAGNOSIS — X58XXXA Exposure to other specified factors, initial encounter: Secondary | ICD-10-CM | POA: Diagnosis not present

## 2018-07-05 DIAGNOSIS — Z590 Homelessness: Secondary | ICD-10-CM

## 2018-07-05 DIAGNOSIS — Z008 Encounter for other general examination: Secondary | ICD-10-CM

## 2018-07-05 DIAGNOSIS — S20229A Contusion of unspecified back wall of thorax, initial encounter: Secondary | ICD-10-CM | POA: Diagnosis present

## 2018-07-05 DIAGNOSIS — E669 Obesity, unspecified: Secondary | ICD-10-CM | POA: Diagnosis present

## 2018-07-05 DIAGNOSIS — R600 Localized edema: Secondary | ICD-10-CM | POA: Diagnosis present

## 2018-07-05 DIAGNOSIS — G8929 Other chronic pain: Secondary | ICD-10-CM | POA: Diagnosis present

## 2018-07-05 DIAGNOSIS — N400 Enlarged prostate without lower urinary tract symptoms: Secondary | ICD-10-CM | POA: Diagnosis present

## 2018-07-05 DIAGNOSIS — L89612 Pressure ulcer of right heel, stage 2: Secondary | ICD-10-CM

## 2018-07-05 DIAGNOSIS — I69351 Hemiplegia and hemiparesis following cerebral infarction affecting right dominant side: Secondary | ICD-10-CM

## 2018-07-05 DIAGNOSIS — Z9181 History of falling: Secondary | ICD-10-CM

## 2018-07-05 DIAGNOSIS — F17200 Nicotine dependence, unspecified, uncomplicated: Secondary | ICD-10-CM | POA: Diagnosis present

## 2018-07-05 DIAGNOSIS — E876 Hypokalemia: Secondary | ICD-10-CM | POA: Diagnosis present

## 2018-07-05 DIAGNOSIS — Z880 Allergy status to penicillin: Secondary | ICD-10-CM | POA: Diagnosis not present

## 2018-07-05 DIAGNOSIS — Z7982 Long term (current) use of aspirin: Secondary | ICD-10-CM

## 2018-07-05 DIAGNOSIS — R609 Edema, unspecified: Secondary | ICD-10-CM | POA: Diagnosis present

## 2018-07-05 DIAGNOSIS — I69320 Aphasia following cerebral infarction: Secondary | ICD-10-CM | POA: Diagnosis not present

## 2018-07-05 DIAGNOSIS — L97529 Non-pressure chronic ulcer of other part of left foot with unspecified severity: Secondary | ICD-10-CM | POA: Diagnosis present

## 2018-07-05 DIAGNOSIS — S99921A Unspecified injury of right foot, initial encounter: Secondary | ICD-10-CM | POA: Diagnosis not present

## 2018-07-05 DIAGNOSIS — Z87891 Personal history of nicotine dependence: Secondary | ICD-10-CM | POA: Diagnosis not present

## 2018-07-05 DIAGNOSIS — F329 Major depressive disorder, single episode, unspecified: Secondary | ICD-10-CM | POA: Diagnosis present

## 2018-07-05 DIAGNOSIS — G629 Polyneuropathy, unspecified: Secondary | ICD-10-CM | POA: Diagnosis present

## 2018-07-05 DIAGNOSIS — Z7951 Long term (current) use of inhaled steroids: Secondary | ICD-10-CM | POA: Diagnosis not present

## 2018-07-05 DIAGNOSIS — R4182 Altered mental status, unspecified: Secondary | ICD-10-CM | POA: Diagnosis not present

## 2018-07-05 LAB — COMPREHENSIVE METABOLIC PANEL
ALBUMIN: 3.6 g/dL (ref 3.5–5.0)
ALT: 17 U/L (ref 0–44)
AST: 23 U/L (ref 15–41)
Alkaline Phosphatase: 65 U/L (ref 38–126)
Anion gap: 9 (ref 5–15)
BUN: 18 mg/dL (ref 6–20)
CHLORIDE: 106 mmol/L (ref 98–111)
CO2: 29 mmol/L (ref 22–32)
CREATININE: 1.13 mg/dL (ref 0.61–1.24)
Calcium: 8.9 mg/dL (ref 8.9–10.3)
GFR calc Af Amer: >60 mL/min (ref >60)
GLUCOSE: 162 mg/dL — AB (ref 70–99)
POTASSIUM: 3.3 mmol/L — AB (ref 3.5–5.1)
Sodium: 144 mmol/L (ref 135–145)
Total Bilirubin: 0.7 mg/dL (ref 0.3–1.2)
Total Protein: 6.6 g/dL (ref 6.5–8.1)

## 2018-07-05 LAB — I-STAT CHEM 8, ED
BUN: 17 mg/dL (ref 6–20)
CHLORIDE: 102 mmol/L (ref 98–111)
Calcium, Ion: 1.19 mmol/L (ref 1.15–1.40)
Creatinine, Ser: 1.1 mg/dL (ref 0.61–1.24)
Glucose, Bld: 149 mg/dL — ABNORMAL HIGH (ref 70–99)
HEMATOCRIT: 38 % — AB (ref 39.0–52.0)
Hemoglobin: 12.9 g/dL — ABNORMAL LOW (ref 13.0–17.0)
POTASSIUM: 3.1 mmol/L — AB (ref 3.5–5.1)
Sodium: 143 mmol/L (ref 135–145)
TCO2: 26 mmol/L (ref 22–32)

## 2018-07-05 LAB — CBC WITH DIFFERENTIAL/PLATELET
Basophils Absolute: 0 10*3/uL (ref 0.0–0.1)
Basophils Relative: 0 %
EOS PCT: 3 %
Eosinophils Absolute: 0.2 10*3/uL (ref 0.0–0.7)
HCT: 39.9 % (ref 39.0–52.0)
Hemoglobin: 13.7 g/dL (ref 13.0–17.0)
LYMPHS ABS: 1.2 10*3/uL (ref 0.7–4.0)
Lymphocytes Relative: 20 %
MCH: 32.6 pg (ref 26.0–34.0)
MCHC: 34.3 g/dL (ref 30.0–36.0)
MCV: 95 fL (ref 78.0–100.0)
MONO ABS: 0.7 10*3/uL (ref 0.1–1.0)
Monocytes Relative: 11 %
Neutro Abs: 4.1 10*3/uL (ref 1.7–7.7)
Neutrophils Relative %: 66 %
Platelets: 191 10*3/uL (ref 150–400)
RBC: 4.2 MIL/uL — AB (ref 4.22–5.81)
RDW: 12.6 % (ref 11.5–15.5)
WBC: 6.2 10*3/uL (ref 4.0–10.5)

## 2018-07-05 LAB — URINALYSIS, ROUTINE W REFLEX MICROSCOPIC
Bilirubin Urine: NEGATIVE
Glucose, UA: NEGATIVE mg/dL
Hgb urine dipstick: NEGATIVE
KETONES UR: NEGATIVE mg/dL
LEUKOCYTES UA: NEGATIVE
NITRITE: NEGATIVE
Protein, ur: NEGATIVE mg/dL
Specific Gravity, Urine: 1.027 (ref 1.005–1.030)
pH: 5 (ref 5.0–8.0)

## 2018-07-05 LAB — BLOOD GAS, ARTERIAL
Acid-Base Excess: 2 mmol/L (ref 0.0–2.0)
BICARBONATE: 26.5 mmol/L (ref 20.0–28.0)
Drawn by: 51425
FIO2: 21
O2 Saturation: 94.2 %
PH ART: 7.405 (ref 7.350–7.450)
PO2 ART: 72.2 mmHg — AB (ref 83.0–108.0)
Patient temperature: 98.6
pCO2 arterial: 43.2 mmHg (ref 32.0–48.0)

## 2018-07-05 LAB — TSH: TSH: 0.69 u[IU]/mL (ref 0.350–4.500)

## 2018-07-05 LAB — CBG MONITORING, ED: GLUCOSE-CAPILLARY: 186 mg/dL — AB (ref 70–99)

## 2018-07-05 LAB — ECHOCARDIOGRAM COMPLETE

## 2018-07-05 LAB — RAPID URINE DRUG SCREEN, HOSP PERFORMED
AMPHETAMINES: NOT DETECTED
BENZODIAZEPINES: NOT DETECTED
Barbiturates: NOT DETECTED
Cocaine: NOT DETECTED
OPIATES: NOT DETECTED
Tetrahydrocannabinol: NOT DETECTED

## 2018-07-05 LAB — I-STAT TROPONIN, ED: Troponin i, poc: 0.01 ng/mL (ref 0.00–0.08)

## 2018-07-05 LAB — AMMONIA: AMMONIA: 20 umol/L (ref 9–35)

## 2018-07-05 LAB — ETHANOL: Alcohol, Ethyl (B): <10 mg/dL (ref <10)

## 2018-07-05 MED ORDER — ASPIRIN EC 81 MG PO TBEC
81.0000 mg | DELAYED_RELEASE_TABLET | Freq: Every day | ORAL | Status: DC
  Administered 2018-07-05 – 2018-07-10 (×6): 81 mg via ORAL
  Filled 2018-07-05 (×6): qty 1

## 2018-07-05 MED ORDER — OXYCODONE-ACETAMINOPHEN 5-325 MG PO TABS
1.0000 | ORAL_TABLET | Freq: Four times a day (QID) | ORAL | Status: DC | PRN
  Administered 2018-07-06 – 2018-07-10 (×11): 1 via ORAL
  Filled 2018-07-05 (×11): qty 1

## 2018-07-05 MED ORDER — ACETAMINOPHEN 325 MG PO TABS
650.0000 mg | ORAL_TABLET | Freq: Four times a day (QID) | ORAL | Status: DC | PRN
  Administered 2018-07-07 – 2018-07-09 (×2): 650 mg via ORAL
  Filled 2018-07-05 (×3): qty 2

## 2018-07-05 MED ORDER — TAMSULOSIN HCL 0.4 MG PO CAPS
0.4000 mg | ORAL_CAPSULE | Freq: Every day | ORAL | Status: DC
  Administered 2018-07-05 – 2018-07-10 (×6): 0.4 mg via ORAL
  Filled 2018-07-05 (×6): qty 1

## 2018-07-05 MED ORDER — ORAL CARE MOUTH RINSE
15.0000 mL | Freq: Two times a day (BID) | OROMUCOSAL | Status: DC
  Administered 2018-07-06 – 2018-07-10 (×8): 15 mL via OROMUCOSAL

## 2018-07-05 MED ORDER — ENOXAPARIN SODIUM 40 MG/0.4ML ~~LOC~~ SOLN
40.0000 mg | SUBCUTANEOUS | Status: DC
  Administered 2018-07-05 – 2018-07-10 (×6): 40 mg via SUBCUTANEOUS
  Filled 2018-07-05 (×6): qty 0.4

## 2018-07-05 MED ORDER — POTASSIUM CHLORIDE CRYS ER 20 MEQ PO TBCR
60.0000 meq | EXTENDED_RELEASE_TABLET | ORAL | Status: AC
  Administered 2018-07-05: 60 meq via ORAL
  Filled 2018-07-05: qty 3

## 2018-07-05 MED ORDER — AMITRIPTYLINE HCL 25 MG PO TABS
75.0000 mg | ORAL_TABLET | Freq: Every day | ORAL | Status: DC
  Administered 2018-07-05 – 2018-07-09 (×5): 75 mg via ORAL
  Filled 2018-07-05 (×5): qty 3

## 2018-07-05 MED ORDER — CHLORHEXIDINE GLUCONATE 0.12 % MT SOLN
15.0000 mL | Freq: Two times a day (BID) | OROMUCOSAL | Status: DC
  Administered 2018-07-05 – 2018-07-10 (×9): 15 mL via OROMUCOSAL
  Filled 2018-07-05 (×9): qty 15

## 2018-07-05 MED ORDER — ALBUTEROL SULFATE (2.5 MG/3ML) 0.083% IN NEBU: 2.5000 mg | INHALATION_SOLUTION | Freq: Four times a day (QID) | RESPIRATORY_TRACT | Status: DC | PRN

## 2018-07-05 MED ORDER — SODIUM CHLORIDE 0.9% FLUSH
3.0000 mL | Freq: Two times a day (BID) | INTRAVENOUS | Status: DC
  Administered 2018-07-05 – 2018-07-10 (×9): 3 mL via INTRAVENOUS

## 2018-07-05 MED ORDER — FUROSEMIDE 10 MG/ML IJ SOLN
40.0000 mg | Freq: Once | INTRAMUSCULAR | Status: AC
  Administered 2018-07-05: 40 mg via INTRAVENOUS
  Filled 2018-07-05: qty 4

## 2018-07-05 MED ORDER — FUROSEMIDE 10 MG/ML IJ SOLN
40.0000 mg | Freq: Two times a day (BID) | INTRAMUSCULAR | Status: DC
  Administered 2018-07-05 – 2018-07-06 (×2): 40 mg via INTRAVENOUS
  Filled 2018-07-05 (×2): qty 4

## 2018-07-05 MED ORDER — CLINDAMYCIN PHOSPHATE 600 MG/50ML IV SOLN
600.0000 mg | Freq: Three times a day (TID) | INTRAVENOUS | Status: DC
  Administered 2018-07-05 – 2018-07-07 (×7): 600 mg via INTRAVENOUS
  Filled 2018-07-05 (×7): qty 50

## 2018-07-05 MED ORDER — TRAMADOL HCL 50 MG PO TABS
50.0000 mg | ORAL_TABLET | Freq: Two times a day (BID) | ORAL | Status: DC
  Administered 2018-07-05 – 2018-07-10 (×11): 50 mg via ORAL
  Filled 2018-07-05 (×11): qty 1

## 2018-07-05 MED ORDER — POTASSIUM CHLORIDE CRYS ER 20 MEQ PO TBCR
40.0000 meq | EXTENDED_RELEASE_TABLET | Freq: Two times a day (BID) | ORAL | Status: AC
  Administered 2018-07-05 (×2): 40 meq via ORAL
  Filled 2018-07-05 (×2): qty 2

## 2018-07-05 MED ORDER — SODIUM CHLORIDE 0.9 % IV SOLN
INTRAVENOUS | Status: DC | PRN
  Administered 2018-07-05 – 2018-07-07 (×2): 250 mL via INTRAVENOUS

## 2018-07-05 MED ORDER — ACETAMINOPHEN 650 MG RE SUPP: 650.0000 mg | Freq: Four times a day (QID) | RECTAL | Status: DC | PRN

## 2018-07-05 MED ORDER — NALOXONE HCL 0.4 MG/ML IJ SOLN
0.4000 mg | Freq: Once | INTRAMUSCULAR | Status: AC
  Administered 2018-07-05: 0.4 mg via INTRAVENOUS
  Filled 2018-07-05: qty 1

## 2018-07-05 MED ORDER — ONDANSETRON HCL 4 MG/2ML IJ SOLN: 4.0000 mg | Freq: Four times a day (QID) | INTRAMUSCULAR | Status: DC | PRN

## 2018-07-05 MED ORDER — ONDANSETRON HCL 4 MG PO TABS: 4.0000 mg | ORAL_TABLET | Freq: Four times a day (QID) | ORAL | Status: DC | PRN

## 2018-07-05 NOTE — H&P (Addendum)
History and Physical    Joseph [name] ZOX:[id number] DOB: 1961-01-25 DOA: 07/05/2018  Referring MD/NP/PA: April Palumbo, MD PCP: Patient, No Pcp Per  Patient coming from: Via EMS  Chief Complaint: Right foot pain  I have personally briefly reviewed patient's old medical records in [facility]   HPI: Joseph [name] is a 57 y.o. male with medical history significant of CVA with residual right-sided weakness, aphasia, chronic pain, HLD, BPH, and GERD; who presented via EMS with complaints of a right ankle pain.  History is limited due to patient's current mental status.  Review of records shows the patient was just recently seen in the emergency department at [facility] on 7/30 for bilateral lower extremity swelling and cellulitis related to a blister of the right foot. Doppler ultrasound of the bilateral lower extremity showed no acute signs of a DVT at that time.  It appears patient was given prescriptions for doxycycline and furosemide prior to being discharged.  However, unclear if patient was taking these medications. EMS reported the patient was living in his car and currently is homeless.   ED Course: Upon admission to the emergency department patient was noted to be afebrile, respirations 17-23, and all other vitals maintained.  Labs revealed normal CBC, potassium 3.1, and glucose 149.  ABG did not reveal signs of hypercapnia.  UDS was negative and alcohol level was undetectable.  CT scan of brain showed no acute abnormalities and scattered small vessel ischemic microangiopathy.  Chest x-ray showed mild borderline cardiomegaly with central vascular congestion.  Patient was given 40 mg of Lasix and dose of Narcan due to lethargy.  There is no reported change in mental status.   Review of Systems  Unable to perform ROS: Mental status change  Cardiovascular: Positive for leg swelling.    Past Medical History:  Diagnosis Date  . Aphasia, late effect of cerebrovascular disease     . Cerebral thrombosis with cerebral infarction (HCC)   . Cerebrovascular accident (HCC)   . Depression   . GERD (gastroesophageal reflux disease)   . Retention of urine, unspecified   . Spastic hemiplegia affecting dominant side (HCC)   . Urinary incontinence     Past Surgical History:  Procedure Laterality Date  . CRANIOTOMY       reports that he has quit smoking. He does not have any smokeless tobacco history on file. He reports that he does not drink alcohol or use drugs.  Allergies  Allergen Reactions  . Penicillins Nausea And Vomiting    No family history on file.  Prior to Admission medications   Medication Sig Start Date End Date Taking? Authorizing Provider  amitriptyline (ELAVIL) 50 MG tablet Take 75 mg by mouth at bedtime. 04/07/18   [provider]  aspirin EC 81 MG tablet Take 1 tablet (81 mg total) by mouth daily. 04/11/14   Etta Grandchild, MD  fluticasone Aleda Grana) 50 MCG/ACT nasal spray instill 2 sprays into each nostril daily 04/12/15   Etta Grandchild, MD  oxyCODONE-acetaminophen (ROXICET) 5-325 MG per tablet Take 1-2 tablets by mouth every 6 (six) hours as needed for severe pain. 03/06/15   Etta Grandchild, MD  tamsulosin (FLOMAX) 0.4 MG CAPS capsule Take 1 capsule (0.4 mg total) by mouth daily. 04/10/14   Etta Grandchild, MD  traMADol (ULTRAM) 50 MG tablet Take 1 tablet (50 mg total) by mouth 2 (two) times daily. Patient taking differently: Take 50-100 mg by mouth every 8 (eight) hours as  needed for moderate pain.  03/06/15   Etta GrandchildJones, Thomas L, MD    Physical Exam:  Constitutional: Somnolent male who is difficult to arouse even with noxious stimuli Vitals:   07/05/18 0221 07/05/18 0301 07/05/18 0302 07/05/18 0435  BP: (!) 116/54   117/70  Pulse: 65   65  Resp: 18   20  Temp: 97.6 F (36.4 C) 98.3 F (36.8 C) 98.3 F (36.8 C)   TempSrc: Oral Rectal Rectal   SpO2: 96%   95%   Eyes: PERRL, lids and conjunctivae normal ENMT: Mucous membranes are  moist. Posterior pharynx clear of any exudate or lesions.   Neck: normal, supple, no masses, no thyromegaly Respiratory: clear to auscultation bilaterally, no wheezing, no crackles. Normal respiratory effort. No accessory muscle use.  Cardiovascular: Regular rate and rhythm, no murmurs / rubs / gallops.  2+ pitting lower extremity edema. 2+ pedal pulses. No carotid bruits.  Abdomen: no tenderness, no masses palpated. No hepatosplenomegaly. Bowel sounds positive.  Musculoskeletal: no clubbing / cyanosis. No joint deformity upper and lower extremities. Good ROM, no contractures. Normal muscle tone.  Skin: Approximately 4 cm x 2 cm wound of the lateral aspect of the right heel with erythema noted to the foot. Neurologic: CN 2-12 grossly intact.  Right-sided weakness noted Psychiatric: Unable to assess due to somnolence.    Labs on Admission: I have personally reviewed following labs and imaging studies  CBC: Recent Labs  Lab 07/05/18 0232 07/05/18 0304  WBC 6.2  --   NEUTROABS 4.1  --   HGB 13.7 12.9*  HCT 39.9 38.0*  MCV 95.0  --   PLT 191  --    Basic Metabolic Panel: Recent Labs  Lab 07/05/18 0304  NA 143  K 3.1*  CL 102  GLUCOSE 149*  BUN 17  CREATININE 1.10   GFR: CrCl cannot be calculated (Unknown ideal weight.). Liver Function Tests: No results for input(s): AST, ALT, ALKPHOS, BILITOT, PROT, ALBUMIN in the last 168 hours. No results for input(s): LIPASE, AMYLASE in the last 168 hours. No results for input(s): AMMONIA in the last 168 hours. Coagulation Profile: No results for input(s): INR, PROTIME in the last 168 hours. Cardiac Enzymes: No results for input(s): CKTOTAL, CKMB, CKMBINDEX, TROPONINI in the last 168 hours. BNP (last 3 results) No results for input(s): PROBNP in the last 8760 hours. HbA1C: No results for input(s): HGBA1C in the last 72 hours. CBG: Recent Labs  Lab 07/05/18 0225  GLUCAP 186*   Lipid Profile: No results for input(s): CHOL, HDL,  LDLCALC, TRIG, CHOLHDL, LDLDIRECT in the last 72 hours. Thyroid Function Tests: No results for input(s): TSH, T4TOTAL, FREET4, T3FREE, THYROIDAB in the last 72 hours. Anemia Panel: No results for input(s): VITAMINB12, FOLATE, FERRITIN, TIBC, IRON, RETICCTPCT in the last 72 hours. Urine analysis:    Component Value Date/Time   COLORURINE YELLOW 07/05/2018 0232   APPEARANCEUR CLEAR 07/05/2018 0232   LABSPEC 1.027 07/05/2018 0232   PHURINE 5.0 07/05/2018 0232   GLUCOSEU NEGATIVE 07/05/2018 0232   GLUCOSEU NEGATIVE 08/09/2014 1647   HGBUR NEGATIVE 07/05/2018 0232   BILIRUBINUR NEGATIVE 07/05/2018 0232   BILIRUBINUR neg 03/10/2012 1602   KETONESUR NEGATIVE 07/05/2018 0232   PROTEINUR NEGATIVE 07/05/2018 0232   UROBILINOGEN 1.0 08/09/2014 1647   NITRITE NEGATIVE 07/05/2018 0232   LEUKOCYTESUR NEGATIVE 07/05/2018 0232   Sepsis Labs: No results found for this or any previous visit (from the past 240 hour(s)).   Radiological Exams on Admission: Ct Head Wo  Contrast  Result Date: 07/05/2018 CLINICAL DATA:  Acute onset of altered mental status. EXAM: CT HEAD WITHOUT CONTRAST TECHNIQUE: Contiguous axial images were obtained from the base of the skull through the vertex without intravenous contrast. COMPARISON:  CT of the head performed 03/14/2010 FINDINGS: Brain: No evidence of acute infarction, hemorrhage, hydrocephalus, extra-axial collection or mass lesion / mass effect. Postoperative change is noted at the left cerebral hemisphere, with ex vacuo dilatation of the left lateral ventricle. Scattered periventricular white matter change likely reflects small vessel ischemic microangiopathy. The brainstem and fourth ventricle are within normal limits. The basal ganglia are unremarkable in appearance. No mass effect or midline shift is seen. Vascular: No hyperdense vessel or unexpected calcification. Skull: There is no evidence of fracture; a craniotomy flap is noted at the left parietal calvarium.  Sinuses/Orbits: The orbits are within normal limits. The paranasal sinuses and mastoid air cells are well-aerated. Other: No significant soft tissue abnormalities are seen. IMPRESSION: 1. No acute intracranial pathology seen on CT. Postoperative change noted on the left. 2. Scattered small vessel ischemic microangiopathy. Electronically Signed   By: Roanna Raider M.D.   On: 07/05/2018 03:46   Dg Chest Portable 1 View  Result Date: 07/05/2018 CLINICAL DATA:  Altered mental status EXAM: PORTABLE CHEST 1 VIEW COMPARISON:  03/06/2015 FINDINGS: Stable borderline cardiomegaly with central pulmonary vascular congestion compatible with mild CHF. Low lung volumes without pulmonary consolidation. No pneumothorax. No acute osseous abnormality. IMPRESSION: Stable borderline cardiomegaly with mild central pulmonary vascular congestion. Electronically Signed   By: Tollie Eth M.D.   On: 07/05/2018 02:59    EKG: Independently reviewed.  Sinus rhythm at 67 beats per minute  Assessment/Plan Right heel ulcer with cellulitis, Right foot pain,: Acute.  Patient had recently been evaluated for cellulitis, and prescribed doxycycline.  Unclear patient was taking doxycycline previously prescribed.   - Admit to a telemetry bed - Check blood cultures - Check x-ray of the right foot  - Clindamycin - Wound care consult  Acute encephalopathy: Patient presents with increased somnolence unable to arouse easily.  UDS was negative and alcohol level was undetectable.  Patient was given a dose of Narcan without any change in somnolence.  CT scan of the brain showed no new acute abnormalities.  Addendum: patient now more alert unclear cause of initial presentation.  Peripheral edema: Acute.  Patient was noted to have borderline cardiomegaly on chest x-ray with 2+ pitting edema of the lower extremities.  Patient was given 40 mg of Lasix IV while in the ED.  Patient just had a negative Doppler ultrasound of the bilateral lower  extremities on 7/30. - Monitor intake and output - Reassess in a.m. and determine if continued IV diuresis is needed - Consider need of repeat Doppler ultrasound of the bilateral lower extremities  History of CVA with residual expressive aphasia and right-sided weakness: Chronic.Review of records also shows that patient chronically has right-sided pain since his stroke.  Hypokalemia: Acute.  Initial potassium 3.1 on admission. - Give 40 mEq of potassium chloride  - Continue to monitor and replace as needed  BPH - Continue Flomax  Tobacco abuse: Patient reportedly still smokes. - Counsel on need of cessation of tobacco   DVT prophylaxis: lovenox Code Status: full  Family Communication: None Disposition Plan: To be determined Consults called: none Admission status: observation  Clydie Braun MD [facility] Pager [phone]   If 7PM-7AM, please contact night-coverage [email or web address] Password [email or web address]  07/05/2018, 5:27 AM

## 2018-07-05 NOTE — Progress Notes (Signed)
[facility] PROGRESS NOTE    Progress Note  Joseph [name]  WGN:[id number] DOB: 22-Mar-1961 DOA: 07/05/2018 PCP: Patient, No Pcp Per     Brief Narrative:   Joseph [name] is an 57 y.o. male past medical history of CVA residual right-sided weakness chronic pain who presents via EMS complaining of right ankle pain history was limited as the patient was encephalopathic.  Right heel ulcer with right foot cellulitis  Assessment/Plan:   Cellulitis of right foot: Culture data has been ordered and is pending. He was started empirically on IV clindamycin. Wound care was consulted. No signs of osteo on imaging.  Acute encephalopathy Patient was somnolent on admission, UDS was negative CT scan showed no acute findings. After IV antibiotics is to follow-up with resolved.  Peripheral edema: Doppler was negative for DVT. Check a 2D echo start him on IV Lasix, he is fluid overloaded on physical exam.  History of CVA with residual deficit: With residual deficits, continue current regimen.  Hypokalemia: Replete orally now resolved. Cont oral KCl tab.  Pressure injury of skin heel stage II: Wound care has been consulted.  DVT prophylaxis: lovenox Family Communication:none Disposition Plan/Barrier to D/C: home in 2-3 days Code Status:     Code Status Orders  (From admission, onward)        Start     Ordered   07/05/18 0545  Full code  Continuous     07/05/18 0546    Code Status History    This patient has a current code status but no historical code status.        IV Access:    Peripheral IV   Procedures and diagnostic studies:   Ct Head Wo Contrast  Result Date: 07/05/2018 CLINICAL DATA:  Acute onset of altered mental status. EXAM: CT HEAD WITHOUT CONTRAST TECHNIQUE: Contiguous axial images were obtained from the base of the skull through the vertex without intravenous contrast. COMPARISON:  CT of the head performed 03/14/2010 FINDINGS: Brain: No evidence  of acute infarction, hemorrhage, hydrocephalus, extra-axial collection or mass lesion / mass effect. Postoperative change is noted at the left cerebral hemisphere, with ex vacuo dilatation of the left lateral ventricle. Scattered periventricular white matter change likely reflects small vessel ischemic microangiopathy. The brainstem and fourth ventricle are within normal limits. The basal ganglia are unremarkable in appearance. No mass effect or midline shift is seen. Vascular: No hyperdense vessel or unexpected calcification. Skull: There is no evidence of fracture; a craniotomy flap is noted at the left parietal calvarium. Sinuses/Orbits: The orbits are within normal limits. The paranasal sinuses and mastoid air cells are well-aerated. Other: No significant soft tissue abnormalities are seen. IMPRESSION: 1. No acute intracranial pathology seen on CT. Postoperative change noted on the left. 2. Scattered small vessel ischemic microangiopathy. Electronically Signed   By: Roanna Raider M.D.   On: 07/05/2018 03:46   Dg Chest Portable 1 View  Result Date: 07/05/2018 CLINICAL DATA:  Altered mental status EXAM: PORTABLE CHEST 1 VIEW COMPARISON:  03/06/2015 FINDINGS: Stable borderline cardiomegaly with central pulmonary vascular congestion compatible with mild CHF. Low lung volumes without pulmonary consolidation. No pneumothorax. No acute osseous abnormality. IMPRESSION: Stable borderline cardiomegaly with mild central pulmonary vascular congestion. Electronically Signed   By: Tollie Eth M.D.   On: 07/05/2018 02:59   Dg Foot 2 Views Right  Result Date: 07/05/2018 CLINICAL DATA:  Acute onset of right foot and ankle pain. EXAM: RIGHT FOOT - 2 VIEW COMPARISON:  None. FINDINGS: There  is no evidence of fracture or dislocation. The joint spaces are preserved. There is no evidence of talar subluxation; the subtalar joint is unremarkable in appearance. Mild soft tissue swelling is noted about the forefoot and midfoot.  IMPRESSION: No evidence of fracture or dislocation. Electronically Signed   By: Roanna RaiderJeffery  Chang M.D.   On: 07/05/2018 06:38     Medical Consultants:    None.  Anti-Infectives:   IV clindamycin.  Subjective:    Joseph PullerLarry Ray [name] as he continues to have bilateral lower extremity pain.  Objective:    Vitals:   07/05/18 0500 07/05/18 0600 07/05/18 0630 07/05/18 0809  BP: 127/64 106/61 118/60 108/64  Pulse: 69 66 70 64  Resp: 17 17 18  (!) 22  Temp:    98.4 F (36.9 C)  TempSrc:    Axillary  SpO2: 95% 95% 98% 96%    Intake/Output Summary (Last 24 hours) at 07/05/2018 1105 Last data filed at 07/05/2018 0600 Gross per 24 hour  Intake -  Output 1600 ml  Net -1600 ml   There were no vitals filed for this visit.  Exam: General exam: In no acute distress. Respiratory system: Good air movement and clear to auscultation. Cardiovascular system: S1 & S2 heard, RRR.  Positive JVD Gastrointestinal system: Abdomen is nondistended, soft and nontender.  Central nervous system: Alert and oriented. No focal neurological deficits. Extremities: 3+ lower extremity edema Skin: Foot ulcer stage II. Psychiatry: Judgement and insight appear normal. Mood & affect appropriate.    Data Reviewed:    Labs: Basic Metabolic Panel: Recent Labs  Lab 07/05/18 0233 07/05/18 0304  NA 144 143  K 3.3* 3.1*  CL 106 102  CO2 29  --   GLUCOSE 162* 149*  BUN 18 17  CREATININE 1.13 1.10  CALCIUM 8.9  --    GFR CrCl cannot be calculated (Unknown ideal weight.). Liver Function Tests: Recent Labs  Lab 07/05/18 0233  AST 23  ALT 17  ALKPHOS 65  BILITOT 0.7  PROT 6.6  ALBUMIN 3.6   No results for input(s): LIPASE, AMYLASE in the last 168 hours. Recent Labs  Lab 07/05/18 0607  AMMONIA 20   Coagulation profile No results for input(s): INR, PROTIME in the last 168 hours.  CBC: Recent Labs  Lab 07/05/18 0232 07/05/18 0304  WBC 6.2  --   NEUTROABS 4.1  --   HGB 13.7 12.9*  HCT  39.9 38.0*  MCV 95.0  --   PLT 191  --    Cardiac Enzymes: No results for input(s): CKTOTAL, CKMB, CKMBINDEX, TROPONINI in the last 168 hours. BNP (last 3 results) No results for input(s): PROBNP in the last 8760 hours. CBG: Recent Labs  Lab 07/05/18 0225  GLUCAP 186*   D-Dimer: No results for input(s): DDIMER in the last 72 hours. Hgb A1c: No results for input(s): HGBA1C in the last 72 hours. Lipid Profile: No results for input(s): CHOL, HDL, LDLCALC, TRIG, CHOLHDL, LDLDIRECT in the last 72 hours. Thyroid function studies: Recent Labs    07/05/18 0233  TSH 0.690   Anemia work up: No results for input(s): VITAMINB12, FOLATE, FERRITIN, TIBC, IRON, RETICCTPCT in the last 72 hours. Sepsis Labs: Recent Labs  Lab 07/05/18 0232  WBC 6.2   Microbiology No results found for this or any previous visit (from the past 240 hour(s)).   Medications:   . aspirin EC  81 mg Oral Daily  . enoxaparin (LOVENOX) injection  40 mg Subcutaneous Q24H  . sodium chloride  flush  3 mL Intravenous Q12H  . tamsulosin  0.4 mg Oral Daily   Continuous Infusions: . clindamycin (CLEOCIN) IV 600 mg (07/05/18 0705)     LOS: 0 days   Marinda Elk  [facility] Pager [phone]  *Please refer to [email or web address], password [email or web address] to get updated schedule on who will round on this patient, as hospitalists switch teams weekly. If 7PM-7AM, please contact night-coverage at [email or web address], password [email or web address] for any overnight needs.  07/05/2018, 11:05 AM

## 2018-07-05 NOTE — Consult Note (Signed)
WOC Nurse wound consult note Reason for Consult: heel and back Nurse reports he has a bruise on his back Wound type: right heel pressure injury Pressure Injury POA: Yes Measurement: 3cm x 3cm x 0cm  Wound bed: 100% eschar Drainage (amount, consistency, odor) serosanguineous  Periwound: edema, distal pulses palpable Patient report extreme numbness and tingling worse in the right foot.  Unclear if he has neuropathy, and if this is related to the pressure injury.  He is unable to tell me much about this ulcer, didn't know it was present.  He is wearing bedroom shoes all the time because of his foot swelling.  Dressing procedure/placement/frequency: Heel is stable at this point, minimal drainage Paint heel with betadine, allow to air dry. Cover with silicone foam dressing Float heel on pillows.   Discussed POC with patient and bedside nurse.  Re consult if needed, will not follow at this time. Thanks  Gerry Heaphy M[employer] MSN, RN,CWOCN, CNS, CWON-AP [phone])

## 2018-07-05 NOTE — ED Notes (Signed)
Patient transported to CT 

## 2018-07-05 NOTE — ED Notes (Signed)
ED TO INPATIENT HANDOFF REPORT  Name/Age/Gender Joseph [name] 57 y.o. male  Code Status    Code Status Orders  (From admission, onward)        Start     Ordered   07/05/18 0545  Full code  Continuous     07/05/18 0546    Code Status History    This patient has a current code status but no historical code status.      Home/SNF/Other Home  Chief Complaint Ankle Pain; Altered Mental Status  Level of Care/Admitting Diagnosis ED Disposition    ED Disposition Condition Comment   Admit  Hospital Area: [place] [[id number]]  Level of Care: Telemetry [5]  Admit to tele based on following criteria: Complex arrhythmia (Bradycardia/Tachycardia)  Diagnosis: Acute encephalopathy [465035]  Admitting Physician: Norval Morton [[id number]]  Attending Physician: Norval Morton [[id number]]  PT Class (Do Not Modify): Observation [104]  PT Acc Code (Do Not Modify): Observation [10022]       Medical History Past Medical History:  Diagnosis Date  . Aphasia, late effect of cerebrovascular disease   . Cerebral thrombosis with cerebral infarction ([place])   . Cerebrovascular accident ([place])   . Depression   . GERD (gastroesophageal reflux disease)   . Retention of urine, unspecified   . Spastic hemiplegia affecting dominant side ([place])   . Urinary incontinence     Allergies Allergies  Allergen Reactions  . Penicillins Nausea And Vomiting    IV Location/Drains/Wounds Patient Lines/Drains/Airways Status   Active Line/Drains/Airways    Name:   Placement date:   Placement time:   Site:   Days:   Peripheral IV 07/05/18 Right Antecubital   07/05/18    0255    Antecubital   less than 1   External Urinary Catheter   07/05/18    0434    -   less than 1          Labs/Imaging Results for orders placed or performed during the hospital encounter of 07/05/18 (from the past 48 hour(s))  CBG monitoring, ED     Status: Abnormal   Collection Time: 07/05/18  2:25 AM   Result Value Ref Range   Glucose-Capillary 186 (H) 70 - 99 mg/dL  CBC with Differential/Platelet     Status: Abnormal   Collection Time: 07/05/18  2:32 AM  Result Value Ref Range   WBC 6.2 4.0 - 10.5 K/uL   RBC 4.20 (L) 4.22 - 5.81 MIL/uL   Hemoglobin 13.7 13.0 - 17.0 g/dL   HCT 39.9 39.0 - 52.0 %   MCV 95.0 78.0 - 100.0 fL   MCH 32.6 26.0 - 34.0 pg   MCHC 34.3 30.0 - 36.0 g/dL   RDW 12.6 11.5 - 15.5 %   Platelets 191 150 - 400 K/uL   Neutrophils Relative % 66 %   Neutro Abs 4.1 1.7 - 7.7 K/uL   Lymphocytes Relative 20 %   Lymphs Abs 1.2 0.7 - 4.0 K/uL   Monocytes Relative 11 %   Monocytes Absolute 0.7 0.1 - 1.0 K/uL   Eosinophils Relative 3 %   Eosinophils Absolute 0.2 0.0 - 0.7 K/uL   Basophils Relative 0 %   Basophils Absolute 0.0 0.0 - 0.1 K/uL    Comment: Performed at [facility], [place] [address], [place], [place] [address]  Urinalysis, Routine w reflex microscopic     Status: None   Collection Time: 07/05/18  2:32 AM  Result Value Ref Range  Color, Urine YELLOW YELLOW   APPearance CLEAR CLEAR   Specific Gravity, Urine 1.027 1.005 - 1.030   pH 5.0 5.0 - 8.0   Glucose, UA NEGATIVE NEGATIVE mg/dL   Hgb urine dipstick NEGATIVE NEGATIVE   Bilirubin Urine NEGATIVE NEGATIVE   Ketones, ur NEGATIVE NEGATIVE mg/dL   Protein, ur NEGATIVE NEGATIVE mg/dL   Nitrite NEGATIVE NEGATIVE   Leukocytes, UA NEGATIVE NEGATIVE    Comment: Performed at [facility] [place] [address], [place], [place] [address]  Rapid urine drug screen (hospital performed)     Status: None   Collection Time: 07/05/18  2:33 AM  Result Value Ref Range   Opiates NONE DETECTED NONE DETECTED   Cocaine NONE DETECTED NONE DETECTED   Benzodiazepines NONE DETECTED NONE DETECTED   Amphetamines NONE DETECTED NONE DETECTED   Tetrahydrocannabinol NONE DETECTED NONE DETECTED   Barbiturates NONE DETECTED NONE DETECTED    Comment: (NOTE) DRUG SCREEN FOR MEDICAL PURPOSES ONLY.  IF  CONFIRMATION IS NEEDED FOR ANY PURPOSE, NOTIFY LAB WITHIN 5 DAYS. LOWEST DETECTABLE LIMITS FOR URINE DRUG SCREEN Drug Class                     Cutoff (ng/mL) Amphetamine and metabolites    1000 Barbiturate and metabolites    200 Benzodiazepine                 109 Tricyclics and metabolites     300 Opiates and metabolites        300 Cocaine and metabolites        300 THC                            50 Performed at [facility], [place] [address] [place], [place] [address]   Ethanol     Status: None   Collection Time: 07/05/18  2:33 AM  Result Value Ref Range   Alcohol, Ethyl (B) <10 <10 mg/dL    Comment: (NOTE) Lowest detectable limit for serum alcohol is 10 mg/dL. For medical purposes only. Performed at [facility], [place] [address], [place], [place] [address]   Comprehensive metabolic panel     Status: Abnormal   Collection Time: 07/05/18  2:33 AM  Result Value Ref Range   Sodium 144 135 - 145 mmol/L   Potassium 3.3 (L) 3.5 - 5.1 mmol/L   Chloride 106 98 - 111 mmol/L   CO2 29 22 - 32 mmol/L   Glucose, Bld 162 (H) 70 - 99 mg/dL   BUN 18 6 - 20 mg/dL   Creatinine, Ser 1.13 0.61 - 1.24 mg/dL   Calcium 8.9 8.9 - 10.3 mg/dL   Total Protein 6.6 6.5 - 8.1 g/dL   Albumin 3.6 3.5 - 5.0 g/dL   AST 23 15 - 41 U/L   ALT 17 0 - 44 U/L   Alkaline Phosphatase 65 38 - 126 U/L   Total Bilirubin 0.7 0.3 - 1.2 mg/dL   GFR calc non Af Amer >60 >60 mL/min   GFR calc Af Amer >60 >60 mL/min    Comment: (NOTE) The eGFR has been calculated using the CKD EPI equation. This calculation has not been validated in all clinical situations. eGFR's persistently <60 mL/min signify possible Chronic Kidney Disease.    Anion gap 9 5 - 15    Comment: Performed at [facility], [place] [address], [place], [place] [address]  TSH  Status: None   Collection Time: 07/05/18  2:33 AM  Result Value Ref Range   TSH 0.690 0.350 - 4.500 uIU/mL     Comment: Performed by a 3rd Generation assay with a functional sensitivity of <=0.01 uIU/mL. Performed at [facility], [place] [address], [place], [place] [address]   I-stat troponin, ED     Status: None   Collection Time: 07/05/18  3:03 AM  Result Value Ref Range   Troponin i, poc 0.01 0.00 - 0.08 ng/mL   Comment 3            Comment: Due to the release kinetics of cTnI, a negative result within the first hours of the onset of symptoms does not rule out myocardial infarction with certainty. If myocardial infarction is still suspected, repeat the test at appropriate intervals.   I-Stat Chem 8, ED     Status: Abnormal   Collection Time: 07/05/18  3:04 AM  Result Value Ref Range   Sodium 143 135 - 145 mmol/L   Potassium 3.1 (L) 3.5 - 5.1 mmol/L   Chloride 102 98 - 111 mmol/L   BUN 17 6 - 20 mg/dL   Creatinine, Ser 1.10 0.61 - 1.24 mg/dL   Glucose, Bld 149 (H) 70 - 99 mg/dL   Calcium, Ion 1.19 1.15 - 1.40 mmol/L   TCO2 26 22 - 32 mmol/L   Hemoglobin 12.9 (L) 13.0 - 17.0 g/dL   HCT 38.0 (L) 39.0 - 52.0 %  Blood gas, arterial ([facility] & [facility] ONLY)     Status: Abnormal   Collection Time: 07/05/18  4:30 AM  Result Value Ref Range   FIO2 21.00    pH, Arterial 7.405 7.350 - 7.450   pCO2 arterial 43.2 32.0 - 48.0 mmHg   pO2, Arterial 72.2 (L) 83.0 - 108.0 mmHg   Bicarbonate 26.5 20.0 - 28.0 mmol/L   Acid-Base Excess 2.0 0.0 - 2.0 mmol/L   O2 Saturation 94.2 %   Patient temperature 98.6    Collection site RIGHT RADIAL    Drawn by [phone]    Sample type ARTERIAL DRAW    Allens test (pass/fail) PASS PASS    Comment: Performed at [facility], [place] [address] [place], [place] [address]  Ammonia     Status: None   Collection Time: 07/05/18  6:07 AM  Result Value Ref Range   Ammonia 20 9 - 35 umol/L    Comment: Performed at [facility], [place] [address] [place], [place] [address]   Ct Head Wo Contrast  Result Date: 07/05/2018 CLINICAL  DATA:  Acute onset of altered mental status. EXAM: CT HEAD WITHOUT CONTRAST TECHNIQUE: Contiguous axial images were obtained from the base of the skull through the vertex without intravenous contrast. COMPARISON:  CT of the head performed 03/14/2010 FINDINGS: Brain: No evidence of acute infarction, hemorrhage, hydrocephalus, extra-axial collection or mass lesion / mass effect. Postoperative change is noted at the left cerebral hemisphere, with ex vacuo dilatation of the left lateral ventricle. Scattered periventricular white matter change likely reflects small vessel ischemic microangiopathy. The brainstem and fourth ventricle are within normal limits. The basal ganglia are unremarkable in appearance. No mass effect or midline shift is seen. Vascular: No hyperdense vessel or unexpected calcification. Skull: There is no evidence of fracture; a craniotomy flap is noted at the left parietal calvarium. Sinuses/Orbits: The orbits are within normal limits. The paranasal sinuses and mastoid air cells are well-aerated. Other: No significant soft tissue abnormalities  are seen. IMPRESSION: 1. No acute intracranial pathology seen on CT. Postoperative change noted on the left. 2. Scattered small vessel ischemic microangiopathy. Electronically Signed   By: Garald Balding M.D.   On: 07/05/2018 03:46   Dg Chest Portable 1 View  Result Date: 07/05/2018 CLINICAL DATA:  Altered mental status EXAM: PORTABLE CHEST 1 VIEW COMPARISON:  03/06/2015 FINDINGS: Stable borderline cardiomegaly with central pulmonary vascular congestion compatible with mild CHF. Low lung volumes without pulmonary consolidation. No pneumothorax. No acute osseous abnormality. IMPRESSION: Stable borderline cardiomegaly with mild central pulmonary vascular congestion. Electronically Signed   By: Ashley Royalty M.D.   On: 07/05/2018 02:59   Dg Foot 2 Views Right  Result Date: 07/05/2018 CLINICAL DATA:  Acute onset of right foot and ankle pain. EXAM: RIGHT FOOT - 2  VIEW COMPARISON:  None. FINDINGS: There is no evidence of fracture or dislocation. The joint spaces are preserved. There is no evidence of talar subluxation; the subtalar joint is unremarkable in appearance. Mild soft tissue swelling is noted about the forefoot and midfoot. IMPRESSION: No evidence of fracture or dislocation. Electronically Signed   By: Garald Balding M.D.   On: 07/05/2018 06:38    Pending Labs Unresulted Labs (From admission, onward)   Start     Ordered   07/05/18 [phone]  Culture, blood (routine x 2)  BLOOD CULTURE X 2,   R     07/05/18 0641      Vitals/Pain Today's Vitals   07/05/18 0435 07/05/18 0500 07/05/18 0600 07/05/18 0630  BP: 117/70 127/64 106/61 118/60  Pulse: 65 69 66 70  Resp: _0 Temp:      TempSrc:      SpO2: 95% 95% 95% 98%  PainSc:        Isolation Precautions No active isolations  Medications Medications  aspirin EC tablet 81 mg (has no administration in time range)  enoxaparin (LOVENOX) injection 40 mg (has no administration in time range)  sodium chloride flush (NS) 0.9 % injection 3 mL (has no administration in time range)  acetaminophen (TYLENOL) tablet 650 mg (has no administration in time range)    Or  acetaminophen (TYLENOL) suppository 650 mg (has no administration in time range)  ondansetron (ZOFRAN) tablet 4 mg (has no administration in time range)    Or  ondansetron (ZOFRAN) injection 4 mg (has no administration in time range)  albuterol (PROVENTIL) (2.5 MG/3ML) 0.083% nebulizer solution 2.5 mg (has no administration in time range)  clindamycin (CLEOCIN) IVPB 600 mg (600 mg Intravenous New Bag/Given 07/05/18 0705)  oxyCODONE-acetaminophen (PERCOCET/ROXICET) 5-325 MG per tablet 1 tablet (has no administration in time range)  tamsulosin (FLOMAX) capsule 0.4 mg (has no administration in time range)  naloxone [facility]) injection 0.4 mg (0.4 mg Intravenous Given 07/05/18 0330)  furosemide (LASIX) injection 40 mg (40 mg Intravenous  Given 07/05/18 0423)  potassium chloride SA (K-DUR,KLOR-CON) CR tablet 60 mEq (60 mEq Oral Given 07/05/18 0612)    Mobility non-ambulatory

## 2018-07-05 NOTE — Progress Notes (Signed)
  Echocardiogram 2D Echocardiogram has been performed.  Joseph [name] 07/05/2018, 12:31 PM

## 2018-07-05 NOTE — ED Notes (Signed)
Bed: WA17 Expected date:  Expected time:  Means of arrival:  Comments: 57 yo M/Right ankle injury

## 2018-07-05 NOTE — ED Notes (Signed)
full rainbow sent down

## 2018-07-05 NOTE — ED Triage Notes (Signed)
Pt comes to ed via ems, c/o homeless living car and having right ankle pain and edema. Pt has right sided deficits from a previous stroke 2011. Pt can not stand up and unable to perform basic ADLS,  V/son arrival 110/64, hr 74, rr18, cbg 139   alert x 4.

## 2018-07-05 NOTE — Progress Notes (Signed)
Referral for pt being homeless given to Child psychotherapistocial Worker. Thanks

## 2018-07-05 NOTE — ED Triage Notes (Addendum)
On secondary review after ems report pt appeared alter mental status, not able to follow conversation by provider.

## 2018-07-05 NOTE — ED Notes (Signed)
ED TO INPATIENT HANDOFF REPORT  Name/Age/Gender Joseph [name] 57 y.o. male  Code Status    Code Status Orders  (From admission, onward)        Start     Ordered   07/05/18 0545  Full code  Continuous     07/05/18 0546    Code Status History    This patient has a current code status but no historical code status.      Home/SNF/Other Home  Chief Complaint Ankle Pain; Altered Mental Status  Level of Care/Admitting Diagnosis ED Disposition    ED Disposition Condition Comment   Admit  [facility]: [facility] [[id number]]  Level of Care: Telemetry [5]  Admit to tele based on following criteria: Complex arrhythmia (Bradycardia/Tachycardia)  Diagnosis: Acute encephalopathy [893810]  Admitting Physician: Norval Morton [[id number]]  Attending Physician: Norval Morton [[id number]]  PT Class (Do Not Modify): Observation [104]  PT Acc Code (Do Not Modify): Observation [10022]       Medical History Past Medical History:  Diagnosis Date  . Aphasia, late effect of cerebrovascular disease   . Cerebral thrombosis with cerebral infarction ([place])   . Cerebrovascular accident ([place])   . Depression   . GERD (gastroesophageal reflux disease)   . Retention of urine, unspecified   . Spastic hemiplegia affecting dominant side ([place])   . Urinary incontinence     Allergies Allergies  Allergen Reactions  . Penicillins Nausea And Vomiting    IV Location/Drains/Wounds Patient Lines/Drains/Airways Status   Active Line/Drains/Airways    Name:   Placement date:   Placement time:   Site:   Days:   Peripheral IV 07/05/18 Right Antecubital   07/05/18    0255    Antecubital   less than 1   External Urinary Catheter   07/05/18    0434    --   less than 1          Labs/Imaging Results for orders placed or performed during the hospital encounter of 07/05/18 (from the past 48 hour(s))  CBG monitoring, ED     Status: Abnormal   Collection Time: 07/05/18  2:25 AM   Result Value Ref Range   Glucose-Capillary 186 (H) 70 - 99 mg/dL  CBC with Differential/Platelet     Status: Abnormal   Collection Time: 07/05/18  2:32 AM  Result Value Ref Range   WBC 6.2 4.0 - 10.5 K/uL   RBC 4.20 (L) 4.22 - 5.81 MIL/uL   Hemoglobin 13.7 13.0 - 17.0 g/dL   HCT 39.9 39.0 - 52.0 %   MCV 95.0 78.0 - 100.0 fL   MCH 32.6 26.0 - 34.0 pg   MCHC 34.3 30.0 - 36.0 g/dL   RDW 12.6 11.5 - 15.5 %   Platelets 191 150 - 400 K/uL   Neutrophils Relative % 66 %   Neutro Abs 4.1 1.7 - 7.7 K/uL   Lymphocytes Relative 20 %   Lymphs Abs 1.2 0.7 - 4.0 K/uL   Monocytes Relative 11 %   Monocytes Absolute 0.7 0.1 - 1.0 K/uL   Eosinophils Relative 3 %   Eosinophils Absolute 0.2 0.0 - 0.7 K/uL   Basophils Relative 0 %   Basophils Absolute 0.0 0.0 - 0.1 K/uL    Comment: Performed at [facility], [place] [address] [place], [place] [address]  Urinalysis, Routine w reflex microscopic     Status: None   Collection Time: 07/05/18  2:32 AM  Result Value Ref Range  Color, Urine YELLOW YELLOW   APPearance CLEAR CLEAR   Specific Gravity, Urine 1.027 1.005 - 1.030   pH 5.0 5.0 - 8.0   Glucose, UA NEGATIVE NEGATIVE mg/dL   Hgb urine dipstick NEGATIVE NEGATIVE   Bilirubin Urine NEGATIVE NEGATIVE   Ketones, ur NEGATIVE NEGATIVE mg/dL   Protein, ur NEGATIVE NEGATIVE mg/dL   Nitrite NEGATIVE NEGATIVE   Leukocytes, UA NEGATIVE NEGATIVE    Comment: Performed at [facility] [place] [address], [place], [place] [address]  Rapid urine drug screen (hospital performed)     Status: None   Collection Time: 07/05/18  2:33 AM  Result Value Ref Range   Opiates NONE DETECTED NONE DETECTED   Cocaine NONE DETECTED NONE DETECTED   Benzodiazepines NONE DETECTED NONE DETECTED   Amphetamines NONE DETECTED NONE DETECTED   Tetrahydrocannabinol NONE DETECTED NONE DETECTED   Barbiturates NONE DETECTED NONE DETECTED    Comment: (NOTE) DRUG SCREEN FOR MEDICAL PURPOSES ONLY.  IF  CONFIRMATION IS NEEDED FOR ANY PURPOSE, NOTIFY LAB WITHIN 5 DAYS. LOWEST DETECTABLE LIMITS FOR URINE DRUG SCREEN Drug Class                     Cutoff (ng/mL) Amphetamine and metabolites    1000 Barbiturate and metabolites    200 Benzodiazepine                 109 Tricyclics and metabolites     300 Opiates and metabolites        300 Cocaine and metabolites        300 THC                            50 Performed at [facility], [place] [address] [place], [place] [address]   Ethanol     Status: None   Collection Time: 07/05/18  2:33 AM  Result Value Ref Range   Alcohol, Ethyl (B) <10 <10 mg/dL    Comment: (NOTE) Lowest detectable limit for serum alcohol is 10 mg/dL. For medical purposes only. Performed at [facility], [place] [address], [place], [place] [address]   Comprehensive metabolic panel     Status: Abnormal   Collection Time: 07/05/18  2:33 AM  Result Value Ref Range   Sodium 144 135 - 145 mmol/L   Potassium 3.3 (L) 3.5 - 5.1 mmol/L   Chloride 106 98 - 111 mmol/L   CO2 29 22 - 32 mmol/L   Glucose, Bld 162 (H) 70 - 99 mg/dL   BUN 18 6 - 20 mg/dL   Creatinine, Ser 1.13 0.61 - 1.24 mg/dL   Calcium 8.9 8.9 - 10.3 mg/dL   Total Protein 6.6 6.5 - 8.1 g/dL   Albumin 3.6 3.5 - 5.0 g/dL   AST 23 15 - 41 U/L   ALT 17 0 - 44 U/L   Alkaline Phosphatase 65 38 - 126 U/L   Total Bilirubin 0.7 0.3 - 1.2 mg/dL   GFR calc non Af Amer >60 >60 mL/min   GFR calc Af Amer >60 >60 mL/min    Comment: (NOTE) The eGFR has been calculated using the CKD EPI equation. This calculation has not been validated in all clinical situations. eGFR's persistently <60 mL/min signify possible Chronic Kidney Disease.    Anion gap 9 5 - 15    Comment: Performed at [facility], [place] [address], [place], [place] [address]  TSH  Status: None   Collection Time: 07/05/18  2:33 AM  Result Value Ref Range   TSH 0.690 0.350 - 4.500 uIU/mL     Comment: Performed by a 3rd Generation assay with a functional sensitivity of <=0.01 uIU/mL. Performed at [facility], [place] [address], [place], [place] [address]   I-stat troponin, ED     Status: None   Collection Time: 07/05/18  3:03 AM  Result Value Ref Range   Troponin i, poc 0.01 0.00 - 0.08 ng/mL   Comment 3            Comment: Due to the release kinetics of cTnI, a negative result within the first hours of the onset of symptoms does not rule out myocardial infarction with certainty. If myocardial infarction is still suspected, repeat the test at appropriate intervals.   I-Stat Chem 8, ED     Status: Abnormal   Collection Time: 07/05/18  3:04 AM  Result Value Ref Range   Sodium 143 135 - 145 mmol/L   Potassium 3.1 (L) 3.5 - 5.1 mmol/L   Chloride 102 98 - 111 mmol/L   BUN 17 6 - 20 mg/dL   Creatinine, Ser 1.10 0.61 - 1.24 mg/dL   Glucose, Bld 149 (H) 70 - 99 mg/dL   Calcium, Ion 1.19 1.15 - 1.40 mmol/L   TCO2 26 22 - 32 mmol/L   Hemoglobin 12.9 (L) 13.0 - 17.0 g/dL   HCT 38.0 (L) 39.0 - 52.0 %  Blood gas, arterial ([facility] & [facility] ONLY)     Status: Abnormal   Collection Time: 07/05/18  4:30 AM  Result Value Ref Range   FIO2 21.00    pH, Arterial 7.405 7.350 - 7.450   pCO2 arterial 43.2 32.0 - 48.0 mmHg   pO2, Arterial 72.2 (L) 83.0 - 108.0 mmHg   Bicarbonate 26.5 20.0 - 28.0 mmol/L   Acid-Base Excess 2.0 0.0 - 2.0 mmol/L   O2 Saturation 94.2 %   Patient temperature 98.6    Collection site RIGHT RADIAL    Drawn by [phone]    Sample type ARTERIAL DRAW    Allens test (pass/fail) PASS PASS    Comment: Performed at [facility], [place] [address] [place], [place] [address]  Ammonia     Status: None   Collection Time: 07/05/18  6:07 AM  Result Value Ref Range   Ammonia 20 9 - 35 umol/L    Comment: Performed at [facility], [place] [address] [place], [place] [address]   Ct Head Wo Contrast  Result Date: 07/05/2018 CLINICAL  DATA:  Acute onset of altered mental status. EXAM: CT HEAD WITHOUT CONTRAST TECHNIQUE: Contiguous axial images were obtained from the base of the skull through the vertex without intravenous contrast. COMPARISON:  CT of the head performed 03/14/2010 FINDINGS: Brain: No evidence of acute infarction, hemorrhage, hydrocephalus, extra-axial collection or mass lesion / mass effect. Postoperative change is noted at the left cerebral hemisphere, with ex vacuo dilatation of the left lateral ventricle. Scattered periventricular white matter change likely reflects small vessel ischemic microangiopathy. The brainstem and fourth ventricle are within normal limits. The basal ganglia are unremarkable in appearance. No mass effect or midline shift is seen. Vascular: No hyperdense vessel or unexpected calcification. Skull: There is no evidence of fracture; a craniotomy flap is noted at the left parietal calvarium. Sinuses/Orbits: The orbits are within normal limits. The paranasal sinuses and mastoid air cells are well-aerated. Other: No significant soft tissue abnormalities  are seen. IMPRESSION: 1. No acute intracranial pathology seen on CT. Postoperative change noted on the left. 2. Scattered small vessel ischemic microangiopathy. Electronically Signed   By: Garald Balding M.D.   On: 07/05/2018 03:46   Dg Chest Portable 1 View  Result Date: 07/05/2018 CLINICAL DATA:  Altered mental status EXAM: PORTABLE CHEST 1 VIEW COMPARISON:  03/06/2015 FINDINGS: Stable borderline cardiomegaly with central pulmonary vascular congestion compatible with mild CHF. Low lung volumes without pulmonary consolidation. No pneumothorax. No acute osseous abnormality. IMPRESSION: Stable borderline cardiomegaly with mild central pulmonary vascular congestion. Electronically Signed   By: Ashley Royalty M.D.   On: 07/05/2018 02:59   Dg Foot 2 Views Right  Result Date: 07/05/2018 CLINICAL DATA:  Acute onset of right foot and ankle pain. EXAM: RIGHT FOOT - 2  VIEW COMPARISON:  None. FINDINGS: There is no evidence of fracture or dislocation. The joint spaces are preserved. There is no evidence of talar subluxation; the subtalar joint is unremarkable in appearance. Mild soft tissue swelling is noted about the forefoot and midfoot. IMPRESSION: No evidence of fracture or dislocation. Electronically Signed   By: Garald Balding M.D.   On: 07/05/2018 06:38    Pending Labs Unresulted Labs (From admission, onward)   Start     Ordered   07/05/18 [phone]  Culture, blood (routine x 2)  BLOOD CULTURE X 2,   R     07/05/18 0641      Vitals/Pain Today's Vitals   07/05/18 0435 07/05/18 0500 07/05/18 0600 07/05/18 0630  BP: 117/70 127/64 106/61 118/60  Pulse: 65 69 66 70  Resp: _0 Temp:      TempSrc:      SpO2: 95% 95% 95% 98%  PainSc:        Isolation Precautions No active isolations  Medications Medications  aspirin EC tablet 81 mg (has no administration in time range)  enoxaparin (LOVENOX) injection 40 mg (has no administration in time range)  sodium chloride flush (NS) 0.9 % injection 3 mL (has no administration in time range)  acetaminophen (TYLENOL) tablet 650 mg (has no administration in time range)    Or  acetaminophen (TYLENOL) suppository 650 mg (has no administration in time range)  ondansetron (ZOFRAN) tablet 4 mg (has no administration in time range)    Or  ondansetron (ZOFRAN) injection 4 mg (has no administration in time range)  albuterol (PROVENTIL) (2.5 MG/3ML) 0.083% nebulizer solution 2.5 mg (has no administration in time range)  clindamycin (CLEOCIN) IVPB 600 mg (600 mg Intravenous New Bag/Given 07/05/18 0705)  oxyCODONE-acetaminophen (PERCOCET/ROXICET) 5-325 MG per tablet 1 tablet (has no administration in time range)  tamsulosin (FLOMAX) capsule 0.4 mg (has no administration in time range)  naloxone [facility]) injection 0.4 mg (0.4 mg Intravenous Given 07/05/18 0330)  furosemide (LASIX) injection 40 mg (40 mg Intravenous  Given 07/05/18 0423)  potassium chloride SA (K-DUR,KLOR-CON) CR tablet 60 mEq (60 mEq Oral Given 07/05/18 0612)    Mobility walks with device

## 2018-07-05 NOTE — ED Provider Notes (Signed)
[facility]-EMERGENCY DEPT Provider Note   CSN: [id number] Arrival date & time: 07/05/18  0208     History   Chief Complaint Chief Complaint  Patient presents with  . Ankle Pain  . Altered Mental Status    HPI Joseph [name] is a 57 y.o. male.  The history is provided by the EMS personnel. The history is limited by the condition of the patient (aphasia and AMA).  Ankle Pain   Incident onset: unknown. Incident location: living in his car. There was no injury mechanism. The pain is present in the right ankle. The pain has been constant since onset. He reports no foreign bodies present. Nothing aggravates the symptoms. He has tried nothing for the symptoms. The treatment provided no relief.  Altered Mental Status   Associated symptoms include confusion and somnolence. His past medical history does not include head trauma.  Patient with a h/o CVA and aphasia brought in by EMS for reported right ankle pain.  Patient is reportedly living in a car.  No reports of trauma.  EMS reported patient is AO4 but patient is unable to stay awake or hold a conversation.  He is repetitive.  He is covered in urine.    Past Medical History:  Diagnosis Date  . Aphasia, late effect of cerebrovascular disease   . Cerebral thrombosis with cerebral infarction ([facility])   . Cerebrovascular accident ([facility])   . Depression   . GERD (gastroesophageal reflux disease)   . Retention of urine, unspecified   . Spastic hemiplegia affecting dominant side ([facility])   . Urinary incontinence     Patient Active Problem List   Diagnosis Date Noted  . Closed rib fracture 02/22/2015  . Hyperlipidemia with target LDL less than 130 04/10/2014  . Degenerative joint disease of knee, right 11/07/2013  . Insomnia 02/28/2013  . Routine general medical examination at a health care facility 02/28/2013  . BPH (benign prostatic hyperplasia) 03/10/2012  . Other abnormal glucose 01/30/2012  . Allergic rhinitis,  cause unspecified 01/30/2012  . Pure hyperglyceridemia 06/30/2011  . HEMATURIA UNSPECIFIED 12/31/2010  . Cerebral artery occlusion with cerebral infarction ([facility]) 12/30/2010  . GERD 12/30/2010    Past Surgical History:  Procedure Laterality Date  . CRANIOTOMY          Home Medications    Prior to Admission medications   Medication Sig Start Date End Date Taking? Authorizing Provider  amitriptyline (ELAVIL) 50 MG tablet Take 75 mg by mouth at bedtime. 04/07/18   [provider]  aspirin EC 81 MG tablet Take 1 tablet (81 mg total) by mouth daily. 04/11/14   Etta Grandchild, MD  fluticasone Aleda Grana) 50 MCG/ACT nasal spray instill 2 sprays into each nostril daily 04/12/15   Etta Grandchild, MD  oxyCODONE-acetaminophen (ROXICET) 5-325 MG per tablet Take 1-2 tablets by mouth every 6 (six) hours as needed for severe pain. 03/06/15   Etta Grandchild, MD  tamsulosin (FLOMAX) 0.4 MG CAPS capsule Take 1 capsule (0.4 mg total) by mouth daily. 04/10/14   Etta Grandchild, MD  traMADol (ULTRAM) 50 MG tablet Take 1 tablet (50 mg total) by mouth 2 (two) times daily. Patient taking differently: Take 50-100 mg by mouth every 8 (eight) hours as needed for moderate pain.  03/06/15   Etta Grandchild, MD    Family History No family history on file.  Social History Social History   Tobacco Use  . Smoking status: Former Smoker  Substance Use Topics  .  Alcohol use: No  . Drug use: No     Allergies   Penicillins   Review of Systems Review of Systems  Unable to perform ROS: Mental status change  Musculoskeletal: Positive for arthralgias.  Psychiatric/Behavioral: Positive for confusion.     Physical Exam Updated Vital Signs BP 127/64   Pulse 69   Temp 98.3 F (36.8 C) (Rectal)   Resp 17   SpO2 95%   Physical Exam  Constitutional: He appears well-developed and well-nourished.  HENT:  Head: Normocephalic and atraumatic.  Right Ear: External ear normal.  Left Ear: External ear  normal.  Nose: Nose normal.  Mouth/Throat: Oropharynx is clear and moist. No oropharyngeal exudate.  Eyes: Pupils are equal, round, and reactive to light. Conjunctivae are normal.  Neck: Normal range of motion. Neck supple. No JVD present.  Cardiovascular: Normal rate, regular rhythm, normal heart sounds and intact distal pulses.  Pulmonary/Chest: Effort normal. He has no wheezes.  Abdominal: Soft. Bowel sounds are normal. There is no tenderness.  Musculoskeletal: He exhibits edema. He exhibits no tenderness.  Neurological: He displays no atrophy and no tremor. He displays no seizure activity. GCS eye subscore is 3. GCS verbal subscore is 3. GCS motor subscore is 5.  Skin: Skin is warm and dry. Capillary refill takes less than 2 seconds. He is not diaphoretic.     ED Treatments / Results  Labs (all labs ordered are listed, but only abnormal results are displayed) Results for orders placed or performed during the hospital encounter of 07/05/18  CBC with Differential/Platelet  Result Value Ref Range   WBC 6.2 4.0 - 10.5 K/uL   RBC 4.20 (L) 4.22 - 5.81 MIL/uL   Hemoglobin 13.7 13.0 - 17.0 g/dL   HCT 16.1 09.6 - 04.5 %   MCV 95.0 78.0 - 100.0 fL   MCH 32.6 26.0 - 34.0 pg   MCHC 34.3 30.0 - 36.0 g/dL   RDW 40.9 81.1 - 91.4 %   Platelets 191 150 - 400 K/uL   Neutrophils Relative % 66 %   Neutro Abs 4.1 1.7 - 7.7 K/uL   Lymphocytes Relative 20 %   Lymphs Abs 1.2 0.7 - 4.0 K/uL   Monocytes Relative 11 %   Monocytes Absolute 0.7 0.1 - 1.0 K/uL   Eosinophils Relative 3 %   Eosinophils Absolute 0.2 0.0 - 0.7 K/uL   Basophils Relative 0 %   Basophils Absolute 0.0 0.0 - 0.1 K/uL  Urinalysis, Routine w reflex microscopic  Result Value Ref Range   Color, Urine YELLOW YELLOW   APPearance CLEAR CLEAR   Specific Gravity, Urine 1.027 1.005 - 1.030   pH 5.0 5.0 - 8.0   Glucose, UA NEGATIVE NEGATIVE mg/dL   Hgb urine dipstick NEGATIVE NEGATIVE   Bilirubin Urine NEGATIVE NEGATIVE   Ketones,  ur NEGATIVE NEGATIVE mg/dL   Protein, ur NEGATIVE NEGATIVE mg/dL   Nitrite NEGATIVE NEGATIVE   Leukocytes, UA NEGATIVE NEGATIVE  Rapid urine drug screen (hospital performed)  Result Value Ref Range   Opiates NONE DETECTED NONE DETECTED   Cocaine NONE DETECTED NONE DETECTED   Benzodiazepines NONE DETECTED NONE DETECTED   Amphetamines NONE DETECTED NONE DETECTED   Tetrahydrocannabinol NONE DETECTED NONE DETECTED   Barbiturates NONE DETECTED NONE DETECTED  Ethanol  Result Value Ref Range   Alcohol, Ethyl (B) <10 <10 mg/dL  Blood gas, arterial ([facility] & [facility] ONLY)  Result Value Ref Range   FIO2 21.00    pH, Arterial 7.405 7.350 - 7.450  pCO2 arterial 43.2 32.0 - 48.0 mmHg   pO2, Arterial 72.2 (L) 83.0 - 108.0 mmHg   Bicarbonate 26.5 20.0 - 28.0 mmol/L   Acid-Base Excess 2.0 0.0 - 2.0 mmol/L   O2 Saturation 94.2 %   Patient temperature 98.6    Collection site RIGHT RADIAL    Drawn by [phone]    Sample type ARTERIAL DRAW    Allens test (pass/fail) PASS PASS  CBG monitoring, ED  Result Value Ref Range   Glucose-Capillary 186 (H) 70 - 99 mg/dL  I-Stat Chem 8, ED  Result Value Ref Range   Sodium 143 135 - 145 mmol/L   Potassium 3.1 (L) 3.5 - 5.1 mmol/L   Chloride 102 98 - 111 mmol/L   BUN 17 6 - 20 mg/dL   Creatinine, Ser 6.04 0.61 - 1.24 mg/dL   Glucose, Bld 540 (H) 70 - 99 mg/dL   Calcium, Ion 9.81 1.91 - 1.40 mmol/L   TCO2 26 22 - 32 mmol/L   Hemoglobin 12.9 (L) 13.0 - 17.0 g/dL   HCT 47.8 (L) 29.5 - 62.1 %  I-stat troponin, ED  Result Value Ref Range   Troponin i, poc 0.01 0.00 - 0.08 ng/mL   Comment 3           Ct Head Wo Contrast  Result Date: 07/05/2018 CLINICAL DATA:  Acute onset of altered mental status. EXAM: CT HEAD WITHOUT CONTRAST TECHNIQUE: Contiguous axial images were obtained from the base of the skull through the vertex without intravenous contrast. COMPARISON:  CT of the head performed 03/14/2010 FINDINGS: Brain: No evidence of acute infarction, hemorrhage,  hydrocephalus, extra-axial collection or mass lesion / mass effect. Postoperative change is noted at the left cerebral hemisphere, with ex vacuo dilatation of the left lateral ventricle. Scattered periventricular white matter change likely reflects small vessel ischemic microangiopathy. The brainstem and fourth ventricle are within normal limits. The basal ganglia are unremarkable in appearance. No mass effect or midline shift is seen. Vascular: No hyperdense vessel or unexpected calcification. Skull: There is no evidence of fracture; a craniotomy flap is noted at the left parietal calvarium. Sinuses/Orbits: The orbits are within normal limits. The paranasal sinuses and mastoid air cells are well-aerated. Other: No significant soft tissue abnormalities are seen. IMPRESSION: 1. No acute intracranial pathology seen on CT. Postoperative change noted on the left. 2. Scattered small vessel ischemic microangiopathy. Electronically Signed   By: Roanna Raider M.D.   On: 07/05/2018 03:46   Dg Chest Portable 1 View  Result Date: 07/05/2018 CLINICAL DATA:  Altered mental status EXAM: PORTABLE CHEST 1 VIEW COMPARISON:  03/06/2015 FINDINGS: Stable borderline cardiomegaly with central pulmonary vascular congestion compatible with mild CHF. Low lung volumes without pulmonary consolidation. No pneumothorax. No acute osseous abnormality. IMPRESSION: Stable borderline cardiomegaly with mild central pulmonary vascular congestion. Electronically Signed   By: Tollie Eth M.D.   On: 07/05/2018 02:59    EKG EKG Interpretation  Date/Time:  Monday July 05 2018 02:43:19 EDT Ventricular Rate:  67 PR Interval:    QRS Duration: 108 QT Interval:  428 QTC Calculation: 452 R Axis:   -18 Text Interpretation:  Sinus rhythm Confirmed by Yaret Hush ([id number]) on 07/05/2018 3:07:12 AM   Radiology Ct Head Wo Contrast  Result Date: 07/05/2018 CLINICAL DATA:  Acute onset of altered mental status. EXAM: CT HEAD WITHOUT CONTRAST  TECHNIQUE: Contiguous axial images were obtained from the base of the skull through the vertex without intravenous contrast. COMPARISON:  CT of the head  performed 03/14/2010 FINDINGS: Brain: No evidence of acute infarction, hemorrhage, hydrocephalus, extra-axial collection or mass lesion / mass effect. Postoperative change is noted at the left cerebral hemisphere, with ex vacuo dilatation of the left lateral ventricle. Scattered periventricular white matter change likely reflects small vessel ischemic microangiopathy. The brainstem and fourth ventricle are within normal limits. The basal ganglia are unremarkable in appearance. No mass effect or midline shift is seen. Vascular: No hyperdense vessel or unexpected calcification. Skull: There is no evidence of fracture; a craniotomy flap is noted at the left parietal calvarium. Sinuses/Orbits: The orbits are within normal limits. The paranasal sinuses and mastoid air cells are well-aerated. Other: No significant soft tissue abnormalities are seen. IMPRESSION: 1. No acute intracranial pathology seen on CT. Postoperative change noted on the left. 2. Scattered small vessel ischemic microangiopathy. Electronically Signed   By: Roanna RaiderJeffery  Chang M.D.   On: 07/05/2018 03:46   Dg Chest Portable 1 View  Result Date: 07/05/2018 CLINICAL DATA:  Altered mental status EXAM: PORTABLE CHEST 1 VIEW COMPARISON:  03/06/2015 FINDINGS: Stable borderline cardiomegaly with central pulmonary vascular congestion compatible with mild CHF. Low lung volumes without pulmonary consolidation. No pneumothorax. No acute osseous abnormality. IMPRESSION: Stable borderline cardiomegaly with mild central pulmonary vascular congestion. Electronically Signed   By: Tollie Ethavid  Kwon M.D.   On: 07/05/2018 02:59    Procedures Procedures (including critical care time)  Medications Ordered in ED Medications  potassium chloride SA (K-DUR,KLOR-CON) CR tablet 60 mEq (has no administration in time range)    naloxone [facility](NARCAN) injection 0.4 mg (0.4 mg Intravenous Given 07/05/18 0330)  furosemide (LASIX) injection 40 mg (40 mg Intravenous Given 07/05/18 0423)    No effect from narcan.  Normal CBG.  Normal tox panel.  Normal PCO2.     I suspect given the large area of CVA that the patient may be post ictal as he is somnolent but my work up does not indicate a source for this.    Final Clinical Impressions(s) / ED Diagnoses   Final diagnoses:  Altered mental status, unspecified altered mental status type    Will admit for observation.      Klinton Candelas, MD 07/05/18 [phone]

## 2018-07-05 NOTE — ED Notes (Signed)
Report given to Julia, RN

## 2018-07-06 LAB — BASIC METABOLIC PANEL
ANION GAP: 9 (ref 5–15)
BUN: 13 mg/dL (ref 6–20)
CHLORIDE: 107 mmol/L (ref 98–111)
CO2: 27 mmol/L (ref 22–32)
Calcium: 8.7 mg/dL — ABNORMAL LOW (ref 8.9–10.3)
Creatinine, Ser: 0.92 mg/dL (ref 0.61–1.24)
GFR calc non Af Amer: >60 mL/min (ref >60)
Glucose, Bld: 116 mg/dL — ABNORMAL HIGH (ref 70–99)
Potassium: 4 mmol/L (ref 3.5–5.1)
Sodium: 143 mmol/L (ref 135–145)

## 2018-07-06 MED ORDER — OXYCODONE-ACETAMINOPHEN 5-325 MG PO TABS
1.0000 | ORAL_TABLET | Freq: Once | ORAL | Status: AC
  Administered 2018-07-06: 1 via ORAL
  Filled 2018-07-06: qty 1

## 2018-07-06 MED ORDER — FUROSEMIDE 10 MG/ML IJ SOLN
40.0000 mg | Freq: Two times a day (BID) | INTRAMUSCULAR | Status: AC
  Administered 2018-07-06 – 2018-07-07 (×2): 40 mg via INTRAVENOUS
  Filled 2018-07-06 (×2): qty 4

## 2018-07-06 NOTE — Clinical Social Work Note (Signed)
Clinical Social Work Assessment  Patient Details  Name: Joseph [name] MRN: [id number] Date of Birth: 1960/12/20  Date of referral:  07/06/18               Reason for consult:                   Permission sought to share information with:  Case Manager, Family Supports Permission granted to share information::  Yes, Verbal Permission Granted  Name::        Agency::     Relationship::  Daughter   Contact Information:     Housing/Transportation Living arrangements for the past 2 months:  [place] of Information:  Patient Patient Interpreter Needed:  None Criminal Activity/Legal Involvement Pertinent to Current Situation/Hospitalization:  No - Comment as needed Significant Relationships:  None Lives with:  Self Do you feel safe going back to the place where you live?  No Need for family participation in patient care:  Yes (Comment)  Care giving concerns:  Patient admitted for right foot pain.  Patient homeless and living in his car.  Patient daughter concern about patient wellbeing and ability to care for self since leaving ALF in [place].    Social Worker assessment / plan:  CSW met with the patient at bedside, explained CSW role and reason for visit- "patient homeless and living in car." CSW inquired about the patient current living situation. Patient seemed to have difficulty explaining his situation, and remembering exact events that led up to where he was found at the movies theater. Patient explained he has been living in his car for the past few weeks after "deciding to move to [place] with a Friend." Patient reports he lives in [place] and came to [place] to see a movie.  Patient gave CSW permission to talk with his daughter Joseph [name] [phone]  CSW contacted the patient daughter to inquire about patient situation. Patient daughter express concerns about patient cognitve ability to make decisions. She reports the patient his  currently homeless. He left his assisted living facility- [facility] in [place] a few weeks ago. She reports the patient did not want to provide them with his disability check. She report the patient was receiving appropriate care because the patient cannot take of himself.   She reports prior to ALF the patient was living in an apartment but got to the point where he was unable to live alone. Patient daughter states "I have been stressed and depressed about my fathers decisions. I have a life to live and I am too young to care for him." Daughter states she is 72.  Daughter reports the patient health continues to decline. She reports his legs are always swollen because he never elevates them and does not rest like he should. She reports the patient should not be driving because he does not have a valid drivers license.   She reports she is no longer his payee. She reports the patient current funds are placed on a debit card which the patient keeps with him. She reports she received mail stating the patient medicaid application has lapse and is incomplete.   CSW will continue to assist with disposition.   Plan: unknown.   Employment status:  Disabled (Comment on whether or not currently receiving Disability) Insurance information:  Medicare PT Recommendations:    Information / Referral to community resources:  [place]  Patient/Family's Response to care:  Patient agreeable to medical care.  Unknown if patient will be agreeable to placement at discharge. Patient   Patient/Family's Understanding of and Emotional Response to Diagnosis, Current Treatment, and Prognosis:  Patient was not able to convey clear thought process. It is unknown if patient has a good understanding of his diagnosis and treatment.   Emotional Assessment Appearance:  Appears stated age Attitude/Demeanor/Rapport:    Affect (typically observed):  Accepting Orientation:  Oriented to Self, Oriented to  Place Alcohol / Substance use:  Not Applicable Psych involvement (Current and /or in the community):  No (Comment)  Discharge Needs  Concerns to be addressed:  Discharge Planning Concerns Readmission within the last 30 days:  No Current discharge risk:  Dependent with Mobility Barriers to Discharge:  Continued Medical Work up   [employer], LCSW 07/06/2018, 4:02 PM

## 2018-07-06 NOTE — Plan of Care (Signed)
  Problem: Nutrition: Goal: Adequate nutrition will be maintained Outcome: Progressing   Problem: Pain Managment: Goal: General experience of comfort will improve Outcome: Progressing   Problem: Elimination: Goal: Will not experience complications related to bowel motility Outcome: Progressing   

## 2018-07-06 NOTE — Progress Notes (Addendum)
[facility] PROGRESS NOTE    Progress Note  Joseph [name]  ZOX:[id number] DOB: 1961/05/25 DOA: 07/05/2018 PCP: Patient, No Pcp Per     Brief Narrative:   Joseph [name] is an 57 y.o. male past medical history of CVA residual right-sided weakness chronic pain who presents via EMS complaining of right ankle pain history was limited as the patient was encephalopathic.  Right heel ulcer with right foot cellulitis  Assessment/Plan:   Cellulitis of right foot: Culture data has been ordered and is pending. He was started empirically on IV clindamycin. Wound care was consulted. No signs of osteo on imaging.  Acute Toxic encephalopathy Patient was somnolent on admission, UDS was negative CT scan showed no acute findings. After IV antibiotics encephalopathy resolved.  Peripheral edema: Doppler was negative for DVT. 2D echo showed preserved EF with no diastolic heart failure, he was started on IV Lasix and he is negative about 2 L creatinine has remained stable continue IV diuresis continue to check basic metabolic panels closely. Continue strict I's and O's, daily weights and restrict his fluids.  History of CVA with residual deficit: With residual deficits, continue current regimen.  Hypokalemia: Replete orally now resolved. Cont oral KCl tab.  Pressure injury of skin heel stage II: Wound care has been consulted.  DVT prophylaxis: lovenox Family Communication:none Disposition Plan/Barrier to D/C: home in 2-3 days Code Status:     Code Status Orders  (From admission, onward)        Start     Ordered   07/05/18 0545  Full code  Continuous     07/05/18 0546    Code Status History    This patient has a current code status but no historical code status.        IV Access:    Peripheral IV   Procedures and diagnostic studies:   Ct Head Wo Contrast  Result Date: 07/05/2018 CLINICAL DATA:  Acute onset of altered mental status. EXAM: CT HEAD WITHOUT  CONTRAST TECHNIQUE: Contiguous axial images were obtained from the base of the skull through the vertex without intravenous contrast. COMPARISON:  CT of the head performed 03/14/2010 FINDINGS: Brain: No evidence of acute infarction, hemorrhage, hydrocephalus, extra-axial collection or mass lesion / mass effect. Postoperative change is noted at the left cerebral hemisphere, with ex vacuo dilatation of the left lateral ventricle. Scattered periventricular white matter change likely reflects small vessel ischemic microangiopathy. The brainstem and fourth ventricle are within normal limits. The basal ganglia are unremarkable in appearance. No mass effect or midline shift is seen. Vascular: No hyperdense vessel or unexpected calcification. Skull: There is no evidence of fracture; a craniotomy flap is noted at the left parietal calvarium. Sinuses/Orbits: The orbits are within normal limits. The paranasal sinuses and mastoid air cells are well-aerated. Other: No significant soft tissue abnormalities are seen. IMPRESSION: 1. No acute intracranial pathology seen on CT. Postoperative change noted on the left. 2. Scattered small vessel ischemic microangiopathy. Electronically Signed   By: Roanna RaiderJeffery  Chang M.D.   On: 07/05/2018 03:46   Dg Chest Portable 1 View  Result Date: 07/05/2018 CLINICAL DATA:  Altered mental status EXAM: PORTABLE CHEST 1 VIEW COMPARISON:  03/06/2015 FINDINGS: Stable borderline cardiomegaly with central pulmonary vascular congestion compatible with mild CHF. Low lung volumes without pulmonary consolidation. No pneumothorax. No acute osseous abnormality. IMPRESSION: Stable borderline cardiomegaly with mild central pulmonary vascular congestion. Electronically Signed   By: Tollie Ethavid  Kwon M.D.   On: 07/05/2018 02:59   Dg  Foot 2 Views Right  Result Date: 07/05/2018 CLINICAL DATA:  Acute onset of right foot and ankle pain. EXAM: RIGHT FOOT - 2 VIEW COMPARISON:  None. FINDINGS: There is no evidence of fracture  or dislocation. The joint spaces are preserved. There is no evidence of talar subluxation; the subtalar joint is unremarkable in appearance. Mild soft tissue swelling is noted about the forefoot and midfoot. IMPRESSION: No evidence of fracture or dislocation. Electronically Signed   By: Roanna Raider M.D.   On: 07/05/2018 06:38     Medical Consultants:    None.  Anti-Infectives:   IV clindamycin.  Subjective:    Joseph [name] as he continues to have bilateral lower extremity pain.  Objective:    Vitals:   07/05/18 0809 07/05/18 2113 07/05/18 2114 07/06/18 0449  BP: 108/64 115/63  (!) 108/56  Pulse: 64 76  65  Resp: (!) 22 18  18   Temp: 98.4 F (36.9 C) 98.6 F (37 C)  97.9 F (36.6 C)  TempSrc: Axillary Oral  Oral  SpO2: 96% 95%  96%  Weight:   117.1 kg (258 lb 2.5 oz)   Height:   6\' 1"  (1.854 m)     Intake/Output Summary (Last 24 hours) at 07/06/2018 1145 Last data filed at 07/06/2018 0955 Gross per 24 hour  Intake 1218.27 ml  Output 3200 ml  Net -1981.73 ml   Filed Weights   07/05/18 2114  Weight: 117.1 kg (258 lb 2.5 oz)    Exam: General exam: In no acute distress. Respiratory system: Good air movement and clear to auscultation. Cardiovascular system: S1 & S2 heard, RRR.  Positive JVD Gastrointestinal system: Abdomen is nondistended, soft and nontender.  Central nervous system: Alert and oriented. No focal neurological deficits. Extremities: 3+ lower extremity edema Skin: Foot ulcer stage II. Psychiatry: Judgement and insight appear normal. Mood & affect appropriate.    Data Reviewed:    Labs: Basic Metabolic Panel: Recent Labs  Lab 07/05/18 0233 07/05/18 0304 07/06/18 0436  NA 144 143 143  K 3.3* 3.1* 4.0  CL 106 102 107  CO2 29  --  27  GLUCOSE 162* 149* 116*  BUN 18 17 13   CREATININE 1.13 1.10 0.92  CALCIUM 8.9  --  8.7*   GFR Estimated Creatinine Clearance: 120.2 mL/min (by C-G formula based on SCr of 0.92 mg/dL). Liver Function  Tests: Recent Labs  Lab 07/05/18 0233  AST 23  ALT 17  ALKPHOS 65  BILITOT 0.7  PROT 6.6  ALBUMIN 3.6   No results for input(s): LIPASE, AMYLASE in the last 168 hours. Recent Labs  Lab 07/05/18 0607  AMMONIA 20   Coagulation profile No results for input(s): INR, PROTIME in the last 168 hours.  CBC: Recent Labs  Lab 07/05/18 0232 07/05/18 0304  WBC 6.2  --   NEUTROABS 4.1  --   HGB 13.7 12.9*  HCT 39.9 38.0*  MCV 95.0  --   PLT 191  --    Cardiac Enzymes: No results for input(s): CKTOTAL, CKMB, CKMBINDEX, TROPONINI in the last 168 hours. BNP (last 3 results) No results for input(s): PROBNP in the last 8760 hours. CBG: Recent Labs  Lab 07/05/18 0225  GLUCAP 186*   D-Dimer: No results for input(s): DDIMER in the last 72 hours. Hgb A1c: No results for input(s): HGBA1C in the last 72 hours. Lipid Profile: No results for input(s): CHOL, HDL, LDLCALC, TRIG, CHOLHDL, LDLDIRECT in the last 72 hours. Thyroid function studies: Recent Labs  07/05/18 0233  TSH 0.690   Anemia work up: No results for input(s): VITAMINB12, FOLATE, FERRITIN, TIBC, IRON, RETICCTPCT in the last 72 hours. Sepsis Labs: Recent Labs  Lab 07/05/18 0232  WBC 6.2   Microbiology No results found for this or any previous visit (from the past 240 hour(s)).   Medications:   . amitriptyline  75 mg Oral QHS  . aspirin EC  81 mg Oral Daily  . chlorhexidine  15 mL Mouth Rinse BID  . enoxaparin (LOVENOX) injection  40 mg Subcutaneous Q24H  . furosemide  40 mg Intravenous Q12H  . mouth rinse  15 mL Mouth Rinse q12n4p  . sodium chloride flush  3 mL Intravenous Q12H  . tamsulosin  0.4 mg Oral Daily  . traMADol  50 mg Oral BID   Continuous Infusions: . sodium chloride Stopped (07/06/18 0210)  . clindamycin (CLEOCIN) IV 600 mg (07/06/18 0647)     LOS: 1 day   Marinda Elk  [facility] Pager [phone]  *Please refer to [email or web address], password [email or web address] to get updated schedule on  who will round on this patient, as hospitalists switch teams weekly. If 7PM-7AM, please contact night-coverage at [email or web address], password [email or web address] for any overnight needs.  07/06/2018, 11:45 AM

## 2018-07-07 DIAGNOSIS — I6930 Unspecified sequelae of cerebral infarction: Secondary | ICD-10-CM

## 2018-07-07 DIAGNOSIS — G9340 Encephalopathy, unspecified: Secondary | ICD-10-CM

## 2018-07-07 DIAGNOSIS — R609 Edema, unspecified: Secondary | ICD-10-CM

## 2018-07-07 DIAGNOSIS — R4182 Altered mental status, unspecified: Secondary | ICD-10-CM

## 2018-07-07 DIAGNOSIS — L03115 Cellulitis of right lower limb: Principal | ICD-10-CM

## 2018-07-07 DIAGNOSIS — N400 Enlarged prostate without lower urinary tract symptoms: Secondary | ICD-10-CM

## 2018-07-07 DIAGNOSIS — L97529 Non-pressure chronic ulcer of other part of left foot with unspecified severity: Secondary | ICD-10-CM

## 2018-07-07 DIAGNOSIS — L89612 Pressure ulcer of right heel, stage 2: Secondary | ICD-10-CM

## 2018-07-07 LAB — BASIC METABOLIC PANEL
Anion gap: 8 (ref 5–15)
BUN: 15 mg/dL (ref 6–20)
CHLORIDE: 103 mmol/L (ref 98–111)
CO2: 30 mmol/L (ref 22–32)
CREATININE: 0.92 mg/dL (ref 0.61–1.24)
Calcium: 8.7 mg/dL — ABNORMAL LOW (ref 8.9–10.3)
GFR calc Af Amer: >60 mL/min (ref >60)
GFR calc non Af Amer: >60 mL/min (ref >60)
GLUCOSE: 101 mg/dL — AB (ref 70–99)
Potassium: 3.7 mmol/L (ref 3.5–5.1)
Sodium: 141 mmol/L (ref 135–145)

## 2018-07-07 MED ORDER — CLINDAMYCIN HCL 300 MG PO CAPS
300.0000 mg | ORAL_CAPSULE | Freq: Four times a day (QID) | ORAL | Status: DC
  Administered 2018-07-07 – 2018-07-10 (×13): 300 mg via ORAL
  Filled 2018-07-07 (×13): qty 1

## 2018-07-07 NOTE — Progress Notes (Signed)
PROGRESS NOTE  Jemal Miskell WUJ:[id number] DOB: 01-14-1961 DOA: 07/05/2018 PCP: Patient, No Pcp Per  HPI/Recap of past 24 hours: Chas Axel is an 57 y.o. male past medical history of CVA residual right-sided weakness chronic pain who presents via EMS complaining of right ankle pain history was limited as the patient was encephalopathic.  Right heel pressure ulcer with right foot cellulitis.  07/07/18: Patient seen and examined at his bedside.  He reports moderate pain at his right foot.  Pain management in place.    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 clindamycin today 07/07/2018 Continue wound care Monitor fever curve Monitor WBC  Hx of CVA with Right sided residual deficit Continue aspirin  Ambulatory dysfunction PT eval Fall precautions  Obesity BMI 34 Weight loss outpatient  Chronic lower extremity edema 2D echo done during this admission with normal LVEF Elevate legs as needed   Code Status: Full code  Family Communication: None at bedside  Disposition Plan: Home versus SNF possibly tomorrow 07/08/18   Consultants:  Wound care specialist  Procedures:  None  Antimicrobials:  P.o. clindamycin  DVT prophylaxis: Subcu Lovenox   Objective: Vitals:   07/06/18 1341 07/06/18 1342 07/06/18 2033 07/07/18 0458  BP: (!) 91/55  118/69 (!) 109/51  Pulse: 64 64 68 (!) 55  Resp: 18  12 15   Temp: 98 F (36.7 C)  98.3 F (36.8 C) 97.8 F (36.6 C)  TempSrc: Oral  Oral Oral  SpO2: 98% 98% 100% 97%  Weight:      Height:        Intake/Output Summary (Last 24 hours) at 07/07/2018 1028 Last data filed at 07/07/2018 0950 Gross per  24 hour  Intake 959.5 ml  Output 1500 ml  Net -540.5 ml   Filed Weights   07/05/18 2114  Weight: 117.1 kg (258 lb 2.5 oz)    Exam:  . General: 57 y.o. year-old male well developed well nourished in no acute distress.  Alert and interactive. . Cardiovascular: Regular rate and rhythm with no rubs or gallops.  No thyromegaly or JVD noted.   Marland Kitchen Respiratory: Clear to auscultation with no wheezes or rales. Good inspiratory effort. . Abdomen: Soft nontender nondistended with normal bowel sounds x4 quadrants. . Musculoskeletal: Bilateral lower extremity edema. 2/4 pulses in all 4 extremities. . Skin: Right heel pressure wound with no drainage . Psychiatry: Mood is appropriate for condition and setting   Data Reviewed: CBC: Recent Labs  Lab 07/05/18 0232 07/05/18 0304  WBC 6.2  --   NEUTROABS 4.1  --   HGB 13.7 12.9*  HCT 39.9 38.0*  MCV 95.0  --   PLT 191  --    Basic Metabolic Panel: Recent Labs  Lab 07/05/18 0233 07/05/18 0304 07/06/18 0436 07/07/18 0412  NA 144 143 143 141  K 3.3* 3.1* 4.0 3.7  CL 106 102 107 103  CO2 29  --  27 30  GLUCOSE 162* 149* 116* 101*  BUN 18 17 13 15   CREATININE 1.13 1.10 0.92 0.92  CALCIUM 8.9  --  8.7* 8.7*   GFR: Estimated Creatinine Clearance: 120.2 mL/min (by C-G formula based  on SCr of 0.92 mg/dL). Liver Function Tests: Recent Labs  Lab 07/05/18 0233  AST 23  ALT 17  ALKPHOS 65  BILITOT 0.7  PROT 6.6  ALBUMIN 3.6   No results for input(s): LIPASE, AMYLASE in the last 168 hours. Recent Labs  Lab 07/05/18 0607  AMMONIA 20   Coagulation Profile: No results for input(s): INR, PROTIME in the last 168 hours. Cardiac Enzymes: No results for input(s): CKTOTAL, CKMB, CKMBINDEX, TROPONINI in the last 168 hours. BNP (last 3 results) No results for input(s): PROBNP in the last 8760 hours. HbA1C: No results for input(s): HGBA1C in the last 72 hours. CBG: Recent Labs  Lab 07/05/18 0225  GLUCAP 186*   Lipid Profile: No  results for input(s): CHOL, HDL, LDLCALC, TRIG, CHOLHDL, LDLDIRECT in the last 72 hours. Thyroid Function Tests: Recent Labs    07/05/18 0233  TSH 0.690   Anemia Panel: No results for input(s): VITAMINB12, FOLATE, FERRITIN, TIBC, IRON, RETICCTPCT in the last 72 hours. Urine analysis:    Component Value Date/Time   COLORURINE YELLOW 07/05/2018 0232   APPEARANCEUR CLEAR 07/05/2018 0232   LABSPEC 1.027 07/05/2018 0232   PHURINE 5.0 07/05/2018 0232   GLUCOSEU NEGATIVE 07/05/2018 0232   GLUCOSEU NEGATIVE 08/09/2014 1647   HGBUR NEGATIVE 07/05/2018 0232   BILIRUBINUR NEGATIVE 07/05/2018 0232   BILIRUBINUR neg 03/10/2012 1602   KETONESUR NEGATIVE 07/05/2018 0232   PROTEINUR NEGATIVE 07/05/2018 0232   UROBILINOGEN 1.0 08/09/2014 1647   NITRITE NEGATIVE 07/05/2018 0232   LEUKOCYTESUR NEGATIVE 07/05/2018 0232   Sepsis Labs: @LABRCNTIP (procalcitonin:4,lacticidven:4)  ) Recent Results (from the past 240 hour(s))  Culture, blood (routine x 2)     Status: None (Preliminary result)   Collection Time: 07/05/18  6:50 AM  Result Value Ref Range Status   Specimen Description   Final    BLOOD LEFT ANTECUBITAL Performed at [facility], [address] [place], [place] [address]    Special Requests   Final    BOTTLES DRAWN AEROBIC AND ANAEROBIC Blood Culture adequate volume Performed at [facility], [address] [place], [place] [address]    Culture   Final    NO GROWTH 1 DAY Performed at [facility], [address] [place], [place] [address]    Report Status PENDING  Incomplete  Culture, blood (routine x 2)     Status: None (Preliminary result)   Collection Time: 07/05/18  6:50 AM  Result Value Ref Range Status   Specimen Description   Final    BLOOD RIGHT ANTECUBITAL Performed at [facility], [address] [place], [place] [address]    Special Requests   Final    BOTTLES DRAWN AEROBIC AND ANAEROBIC Blood  Culture adequate volume Performed at [facility], [address] [place], [place] [address]    Culture   Final    NO GROWTH 1 DAY Performed at [facility], [address] [place], [place] [address]    Report Status PENDING  Incomplete      Studies: No results found.  Scheduled Meds: . amitriptyline  75 mg Oral QHS  . aspirin EC  81 mg Oral Daily  . chlorhexidine  15 mL Mouth Rinse BID  . clindamycin  300 mg Oral Q6H  . enoxaparin (LOVENOX) injection  40 mg Subcutaneous Q24H  . mouth rinse  15 mL Mouth Rinse q12n4p  . sodium chloride flush  3 mL Intravenous Q12H  . tamsulosin  0.4 mg Oral Daily  . traMADol  50 mg Oral BID    Continuous Infusions: . sodium chloride Stopped (07/07/18 0538)     LOS: 2 days     Darlin Drop, MD [facility] Pager [phone]  If 7PM-7AM, please contact night-coverage [email or web address] Password [email or web address] 07/07/2018, 10:28 AM

## 2018-07-08 DIAGNOSIS — Z87891 Personal history of nicotine dependence: Secondary | ICD-10-CM

## 2018-07-08 DIAGNOSIS — Z008 Encounter for other general examination: Secondary | ICD-10-CM

## 2018-07-08 LAB — CBC
HEMATOCRIT: 42.8 % (ref 39.0–52.0)
Hemoglobin: 14.5 g/dL (ref 13.0–17.0)
MCH: 32.4 pg (ref 26.0–34.0)
MCHC: 33.9 g/dL (ref 30.0–36.0)
MCV: 95.7 fL (ref 78.0–100.0)
Platelets: 229 10*3/uL (ref 150–400)
RBC: 4.47 MIL/uL (ref 4.22–5.81)
RDW: 12.5 % (ref 11.5–15.5)
WBC: 4.6 10*3/uL (ref 4.0–10.5)

## 2018-07-08 LAB — BASIC METABOLIC PANEL
Anion gap: 9 (ref 5–15)
BUN: 17 mg/dL (ref 6–20)
CHLORIDE: 102 mmol/L (ref 98–111)
CO2: 29 mmol/L (ref 22–32)
Calcium: 9.1 mg/dL (ref 8.9–10.3)
Creatinine, Ser: 0.89 mg/dL (ref 0.61–1.24)
GFR calc non Af Amer: >60 mL/min (ref >60)
Glucose, Bld: 119 mg/dL — ABNORMAL HIGH (ref 70–99)
POTASSIUM: 4.2 mmol/L (ref 3.5–5.1)
Sodium: 140 mmol/L (ref 135–145)

## 2018-07-08 MED ORDER — GABAPENTIN 300 MG PO CAPS
300.0000 mg | ORAL_CAPSULE | Freq: Three times a day (TID) | ORAL | Status: DC
  Administered 2018-07-08 – 2018-07-10 (×8): 300 mg via ORAL
  Filled 2018-07-08 (×8): qty 1

## 2018-07-08 NOTE — Plan of Care (Signed)
Pt is still confused at times. It does not appear that cognitively he is ready to be discharged if he has to care for himself.

## 2018-07-08 NOTE — Evaluation (Signed)
Physical Therapy Evaluation Patient Details Name: Joseph PullerLarry Ray [name] MRN: [id number] DOB: 1960-12-29 Today's Date: 07/08/2018   History of Present Illness  57 yo male admitted with R foot pressure injury/cellultis, encephalopathy. Hx of CVA with R side spastic hemiplegia, chronic pain  Clinical Impression  On eval, pt required Min assist for mobility. He was able to stand and take a few lateral steps along side of bed. Pt c/o R foot pain with activity/WBing, 2* pressure ulcer/cellulitis, which is significantly limiting his mobility and negatively impacting his functional independence. Currently he is at risk for falls when mobilizing. Discussed d/c plan-pt is unsure at time of eval. Will recommend SNF at this time. Will continue to follow and progress activity as tolerated.     Follow Up Recommendations SNF    Equipment Recommendations  None recommended by PT    Recommendations for Other Services       Precautions / Restrictions Precautions Precautions: Fall Restrictions Weight Bearing Restrictions: No      Mobility  Bed Mobility Overal bed mobility: Needs Assistance Bed Mobility: Supine to Sit;Sit to Supine     Supine to sit: Supervision Sit to supine: Supervision   General bed mobility comments: for safety. Pt uses momentum to complete tasks  Transfers Overall transfer level: Needs assistance   Transfers: Sit to/from Stand Sit to Stand: Min assist;From elevated surface         General transfer comment: x 3. LOB posteriorly, back onto bed, x 2. On 3rd attempt, pt was able to stand with assist from therapist. Assist to shift weight anteriorly, stabilize, control desent.   Ambulation/Gait Ambulation/Gait assistance: Min assist           General Gait Details: side steps along side of bed with Min assist to stabilize pt. Deferred any ambulation away from bed for safety reasons, pain.   Stairs            Wheelchair Mobility    Modified Rankin (Stroke  Patients Only)       Balance Overall balance assessment: Needs assistance           Standing balance-Leahy Scale: Poor                               Pertinent Vitals/Pain Pain Assessment: 0-10 Pain Score: 7  Pain Location: R foot with activity/WBing Pain Descriptors / Indicators: Constant;Sore;Aching Pain Intervention(s): Limited activity within patient's tolerance    Home Living Family/patient expects to be discharged to:: Shelter/Homeless                      Prior Function Level of Independence: Independent               Hand Dominance        Extremity/Trunk Assessment   Upper Extremity Assessment Upper Extremity Assessment: RUE deficits/detail RUE Deficits / Details: No funtional use. Contracted    Lower Extremity Assessment Lower Extremity Assessment: RLE deficits/detail RLE Deficits / Details: able to weightbear.        Communication   Communication: Expressive difficulties(expressive aphasia)  Cognition Arousal/Alertness: Awake/alert Behavior During Therapy: WFL for tasks assessed/performed Overall Cognitive Status: Within Functional Limits for tasks assessed                                 General Comments: some difficulty assessing due to expressive aphasia. Pt  followed commands well and responded appropriately      General Comments      Exercises     Assessment/Plan    PT Assessment Patient needs continued PT services  PT Problem List Decreased mobility;Decreased activity tolerance;Decreased balance;Pain;Decreased knowledge of use of DME;Decreased coordination;Impaired tone       PT Treatment Interventions DME instruction;Gait training;Therapeutic activities;Therapeutic exercise;Patient/family education;Balance training;Functional mobility training    PT Goals (Current goals can be found in the Care Plan section)  Acute Rehab PT Goals Patient Stated Goal: less pain.  PT Goal Formulation: With  patient Time For Goal Achievement: 07/22/18 Potential to Achieve Goals: Fair    Frequency Min 3X/week   Barriers to discharge        Co-evaluation               AM-PAC PT "6 Clicks" Daily Activity  Outcome Measure Difficulty turning over in bed (including adjusting bedclothes, sheets and blankets)?: A Lot Difficulty moving from lying on back to sitting on the side of the bed? : A Lot Difficulty sitting down on and standing up from a chair with arms (e.g., wheelchair, bedside commode, etc,.)?: Unable Help needed moving to and from a bed to chair (including a wheelchair)?: A Little Help needed walking in hospital room?: A Lot Help needed climbing 3-5 steps with a railing? : Total 6 Click Score: 11    End of Session   Activity Tolerance: Patient limited by pain Patient left: in bed;with call bell/phone within reach;with bed alarm set   PT Visit Diagnosis: Pain;Other abnormalities of gait and mobility (R26.89);Unsteadiness on feet (R26.81) Pain - Right/Left: Right Pain - part of body: Ankle and joints of foot    Time: 6045-4098 PT Time Calculation (min) (ACUTE ONLY): 17 min   Charges:   PT Evaluation $PT Eval Moderate Complexity: 1 Mod          Rebeca Alert, MPT Pager: [phone]

## 2018-07-08 NOTE — Care Management Important Message (Signed)
Important Message  Patient Details  Name: Joseph PullerLarry Ray [name] MRN: [id number] Date of Birth: 1961-02-28   Medicare Important Message Given:  Yes    Caren MacadamFuller, Edynn Gillock 07/08/2018, 12:24 PMImportant Message  Patient Details  Name: Joseph PullerLarry Ray [name] MRN: [id number] Date of Birth: 1961-02-28   Medicare Important Message Given:  Yes    Caren MacadamFuller, Neeva Trew 07/08/2018, 12:24 PM

## 2018-07-08 NOTE — Progress Notes (Addendum)
PROGRESS NOTE  Joseph [name] [id number] DOB: Aug 28, 1961 DOA: 07/05/2018 PCP: Patient, No Pcp Per  HPI/Recap of past 24 hours: Joseph [name] is an 57 y.o. male past medical history of CVA residual right-sided weakness chronic pain who presents via EMS complaining of right ankle pain history was limited as the patient was encephalopathic.  Right heel pressure ulcer with right foot cellulitis.  07/07/18: Patient seen and examined at his bedside.  He reports moderate pain at his right foot.  Pain management in place.  07/08/18: Seen and examined at bedside.  A little confused.  No new motor or sensory deficits.  Right-sided weakness which is chronic from previous CVA.  Alert and oriented x2.     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 Clindamycin Continue wound care Afebrile with no leukocytosis Reports burning sensation Suspect neuropathic pain Start gabapentin 300 mg 3 times daily  Acute metabolic encephalopathy unclear etiology Reorient as needed Fall precautions  Hx of CVA with Right sided residual deficit Continue aspirin  Ambulatory dysfunction post CVA PT recommends SNF Fall precautions Social worker following for placement  Obesity BMI 34 Weight loss outpatient  Chronic lower extremity edema 2D echo done during this admission with normal LVEF Elevate legs as needed   Code Status: Full code  Family Communication: None at bedside  Disposition Plan: SNF possibly tomorrow 07/09/18 pending placement   Consultants:  Wound care specialist  Procedures:  None  Antimicrobials:  P.o. clindamycin  DVT prophylaxis: Subcu  Lovenox   Objective: Vitals:   07/07/18 1353 07/07/18 2020 07/08/18 0423 07/08/18 0742  BP: 102/78 119/64 (!) 109/57   Pulse: 71 72 (!) 59   Resp: 12 20 18    Temp: 99.2 F (37.3 C) 98.3 F (36.8 C) 98 F (36.7 C)   TempSrc: Oral     SpO2: 100% 95% 95%   Weight:    114.7 kg  Height:        Intake/Output Summary (Last 24 hours) at 07/08/2018 1235 Last data filed at 07/08/2018 0440 Gross per 24 hour  Intake 358.29 ml  Output 605 ml  Net -246.71 ml   Filed Weights   07/05/18 2114 07/08/18 0742  Weight: 117.1 kg 114.7 kg    Exam:  . General: 57 y.o. year-old male well-developed well-nourished no acute distress.  Alert and oriented x2.   . Cardiovascular: Regular rate and rhythm with no rubs or gallops.  No thyromegaly or JVD noted. Marland Kitchen. Respiratory: Clear to auscultation with no wheezes or rales. Good inspiratory effort. . Abdomen: Soft nontender nondistended with normal bowel sounds x4 quadrants. . Musculoskeletal: Bilateral lower extremity edema. 2/4 pulses in all 4 extremities. . Skin: Right heel pressure wound with no drainage . Psychiatry: Mood is appropriate for condition and setting   Data Reviewed: CBC: Recent Labs  Lab 07/05/18 0232 07/05/18 0304 07/08/18 0415  WBC 6.2  --  4.6  NEUTROABS 4.1  --   --   HGB 13.7 12.9* 14.5  HCT 39.9 38.0* 42.8  MCV 95.0  --  95.7  PLT 191  --  229   Basic Metabolic Panel: Recent Labs  Lab 07/05/18 0233 07/05/18 0304 07/06/18  1610 07/07/18 0412 07/08/18 0415  NA 144 143 143 141 140  K 3.3* 3.1* 4.0 3.7 4.2  CL 106 102 107 103 102  CO2 29  --  27 30 29   GLUCOSE 162* 149* 116* 101* 119*  BUN 18 17 13 15 17   CREATININE 1.13 1.10 0.92 0.92 0.89  CALCIUM 8.9  --  8.7* 8.7* 9.1   GFR: Estimated Creatinine Clearance: 123 mL/min (by C-G formula based on SCr of 0.89 mg/dL). Liver Function Tests: Recent Labs  Lab 07/05/18 0233  AST 23  ALT 17  ALKPHOS 65  BILITOT 0.7  PROT 6.6  ALBUMIN 3.6   No results for  input(s): LIPASE, AMYLASE in the last 168 hours. Recent Labs  Lab 07/05/18 0607  AMMONIA 20   Coagulation Profile: No results for input(s): INR, PROTIME in the last 168 hours. Cardiac Enzymes: No results for input(s): CKTOTAL, CKMB, CKMBINDEX, TROPONINI in the last 168 hours. BNP (last 3 results) No results for input(s): PROBNP in the last 8760 hours. HbA1C: No results for input(s): HGBA1C in the last 72 hours. CBG: Recent Labs  Lab 07/05/18 0225  GLUCAP 186*   Lipid Profile: No results for input(s): CHOL, HDL, LDLCALC, TRIG, CHOLHDL, LDLDIRECT in the last 72 hours. Thyroid Function Tests: No results for input(s): TSH, T4TOTAL, FREET4, T3FREE, THYROIDAB in the last 72 hours. Anemia Panel: No results for input(s): VITAMINB12, FOLATE, FERRITIN, TIBC, IRON, RETICCTPCT in the last 72 hours. Urine analysis:    Component Value Date/Time   COLORURINE YELLOW 07/05/2018 0232   APPEARANCEUR CLEAR 07/05/2018 0232   LABSPEC 1.027 07/05/2018 0232   PHURINE 5.0 07/05/2018 0232   GLUCOSEU NEGATIVE 07/05/2018 0232   GLUCOSEU NEGATIVE 08/09/2014 1647   HGBUR NEGATIVE 07/05/2018 0232   BILIRUBINUR NEGATIVE 07/05/2018 0232   BILIRUBINUR neg 03/10/2012 1602   KETONESUR NEGATIVE 07/05/2018 0232   PROTEINUR NEGATIVE 07/05/2018 0232   UROBILINOGEN 1.0 08/09/2014 1647   NITRITE NEGATIVE 07/05/2018 0232   LEUKOCYTESUR NEGATIVE 07/05/2018 0232   Sepsis Labs: @LABRCNTIP (procalcitonin:4,lacticidven:4)  ) Recent Results (from the past 240 hour(s))  Culture, blood (routine x 2)     Status: None (Preliminary result)   Collection Time: 07/05/18  6:50 AM  Result Value Ref Range Status   Specimen Description   Final    BLOOD LEFT ANTECUBITAL Performed at [facility], [address], [place], [place] [address]    Special Requests   Final    BOTTLES DRAWN AEROBIC AND ANAEROBIC Blood Culture adequate volume Performed at [facility], [address]  [place], [place] [address]    Culture   Final    NO GROWTH 2 DAYS Performed at [facility], [address] [place], [place] [address]    Report Status PENDING  Incomplete  Culture, blood (routine x 2)     Status: None (Preliminary result)   Collection Time: 07/05/18  6:50 AM  Result Value Ref Range Status   Specimen Description   Final    BLOOD RIGHT ANTECUBITAL Performed at [facility], [address] [place], [place] [address]    Special Requests   Final    BOTTLES DRAWN AEROBIC AND ANAEROBIC Blood Culture adequate volume Performed at [facility], [address], [place], [place] [address]    Culture   Final    NO GROWTH 2 DAYS Performed at [facility], [address], [place], [place] [address]    Report Status PENDING  Incomplete      Studies: No results found.  Scheduled Meds: . amitriptyline  75 mg Oral QHS  . aspirin EC  81 mg Oral Daily  . chlorhexidine  15 mL Mouth Rinse BID  . clindamycin  300 mg Oral Q6H  . enoxaparin (LOVENOX) injection  40 mg Subcutaneous Q24H  . gabapentin  300 mg Oral TID  . mouth rinse  15 mL Mouth Rinse q12n4p  . sodium chloride flush  3 mL Intravenous Q12H  . tamsulosin  0.4 mg Oral Daily  . traMADol  50 mg Oral BID    Continuous Infusions: . sodium chloride Stopped (07/07/18 0538)     LOS: 3 days     Darlin Drop, MD [facility] Pager [phone]  If 7PM-7AM, please contact night-coverage [email or web address] Password [facility] 07/08/2018, 12:35 PM

## 2018-07-08 NOTE — Consult Note (Addendum)
[facility] Face-to-Face Psychiatry Consult   Reason for Consult:  Capacity evaluation  Referring Physician:  Dr. Nevada Crane Patient Identification: Joseph [name] MRN:  [id number] Principal Diagnosis: Evaluation by psychiatric service required Diagnosis:   Patient Active Problem List   Diagnosis Date Noted  . Acute encephalopathy [G93.40] 07/05/2018  . Cellulitis of right foot [L03.115] 07/05/2018  . Peripheral edema [R60.9] 07/05/2018  . History of CVA with residual deficit [I69.30] 07/05/2018  . Pressure ulcer of right heel, stage 2 [L89.612] 07/05/2018  . Chronic ulcer of great toe of left foot ([place]) [L97.529] 07/05/2018  . Closed rib fracture [S22.39XA] 02/22/2015  . Hyperlipidemia with target LDL less than 130 [E78.5] 04/10/2014  . 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 Insomnia [G47.00] 02/28/2013  . Routine general medical examination at a health care facility [Z00.00] 02/28/2013  . BPH (benign prostatic hyperplasia) [N40.0] 03/10/2012  . Other abnormal glucose [R73.09] 01/30/2012  . Allergic rhinitis, cause unspecified [J30.9] 01/30/2012  . 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 HEMATURIA UNSPECIFIED [R31.9] 12/31/2010  . Cerebral artery occlusion with cerebral infarction ([place]) [I63.50] 12/30/2010  . GERD [K21.9] 12/30/2010    Total Time spent with patient: 1 hour  Subjective:   Joseph [name] is a 57 y.o. male patient admitted with right foot cellulitis.  HPI:  Per chart review, patient was admitted with right foot cellulitis and right heel pressure ulcer. His hospital course has been complicated by encephalopathy. He is recommended for SNF placement due to his high risk for falls and cognitive deficits which make it unsafe for him to return home at this time.   On interview, Joseph [name] reports that he was sleeping in his car prior to hospitalization. He is unaware that he has right foot cellulitis but he is able to express that he has right  foot pain. He does not know what medical treatment he is receiving in the hospital. He is confused throughout the interview. He is oriented to self and place. He reports, "It is a holiday" when asked the date. He denies a psychiatric history although there is a documented history of depression. He reports that his mood is okay. He denies SI, HI or AVH. He denies problems with sleep or appetite.   Past Psychiatric History: Depression   Risk to Self:  None. Denies SI. Risk to Others:  None. Denies HI.  Prior Inpatient Therapy:  Denies  Prior Outpatient Therapy:  Denies   Past Medical History:  Past Medical History:  Diagnosis Date  . Aphasia, late effect of cerebrovascular disease   . Cerebral thrombosis with cerebral infarction ([place])   . Cerebrovascular accident ([place])   . Depression   . GERD (gastroesophageal reflux disease)   . Retention of urine, unspecified   . 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 Urinary incontinence     Past Surgical History:  Procedure Laterality Date  . CRANIOTOMY     Family History: History reviewed. No pertinent family history. Family Psychiatric  History: Denies  Social History:  Social History   Substance and Sexual Activity  Alcohol Use No     Social History   Substance and Sexual Activity  Drug Use No    Social History   Socioeconomic History  . Marital status: Legally Separated    Spouse name: Not on file  . Number of children: Not on file  . Years of education: Not on file  . Highest education level: Not on file  Occupational History  .  Not on file  Social Needs  . Financial resource strain: Not on file  . Food insecurity:    Worry: Not on file    Inability: Not on file  . Transportation needs:    Medical: Not on file    Non-medical: Not on file  Tobacco Use  . Smoking status: Former Research scientist (life sciences)  . Smokeless tobacco: Never Used  Substance and Sexual Activity  . Alcohol use: No  . Drug use: No  . Sexual activity:  Not Currently  Lifestyle  . Physical activity:    Days per week: Not on file    Minutes per session: Not on file  . Stress: Not on file  Relationships  . Social connections:    Talks on phone: Not on file    Gets together: Not on file    Attends religious service: Not on file    Active member of club or organization: Not on file    Attends meetings of clubs or organizations: Not on file    Relationship status: Not on file  Other Topics Concern  . Not on file  Social History Narrative  . Not on file   Additional Social History: He is homeless. He was living in an ALF a few weeks ago but left because he did not want to provide them with his disability check per social work note. He has 3 adult children. He reports an estranged relationship with them. He is divorced x 2. He previously owned 3 car businesses. He reports that he lost his businesses after his stroke 10 years ago. He denies alcohol or illicit substance use.     Allergies:   Allergies  Allergen Reactions  . Penicillins Nausea And Vomiting    Has patient had a PCN reaction causing immediate rash, facial/tongue/throat swelling, SOB or lightheadedness with hypotension: No Has patient had a PCN reaction causing severe rash involving mucus membranes or skin necrosis: No Has patient had a PCN reaction that required hospitalization: No Has patient had a PCN reaction occurring within the last 10 years: Unknown If all of the above answers are "NO", then may proceed with Cephalosporin use.     Labs:  Results for orders placed or performed during the hospital encounter of 07/05/18 (from the past 48 hour(s))  Basic metabolic panel     Status: Abnormal   Collection Time: 07/07/18  4:12 AM  Result Value Ref Range   Sodium 141 135 - 145 mmol/L   Potassium 3.7 3.5 - 5.1 mmol/L   Chloride 103 98 - 111 mmol/L   CO2 30 22 - 32 mmol/L   Glucose, Bld 101 (H) 70 - 99 mg/dL   BUN 15 6 - 20 mg/dL   Creatinine, Ser 0.92 0.61 - 1.24 mg/dL    Calcium 8.7 (L) 8.9 - 10.3 mg/dL   GFR calc non Af Amer >60 >60 mL/min   GFR calc Af Amer >60 >60 mL/min    Comment: (NOTE) The eGFR has been calculated using the CKD EPI equation. This calculation has not been validated in all clinical situations. eGFR's persistently <60 mL/min signify possible Chronic Kidney Disease.    Anion gap 8 5 - 15    Comment: Performed at [facility], [place] [address] [place], [place] [address]  Basic metabolic panel     Status: Abnormal   Collection Time: 07/08/18  4:15 AM  Result Value Ref Range   Sodium 140 135 - 145 mmol/L   Potassium 4.2 3.5 - 5.1  mmol/L   Chloride 102 98 - 111 mmol/L   CO2 29 22 - 32 mmol/L   Glucose, Bld 119 (H) 70 - 99 mg/dL   BUN 17 6 - 20 mg/dL   Creatinine, Ser 0.89 0.61 - 1.24 mg/dL   Calcium 9.1 8.9 - 10.3 mg/dL   GFR calc non Af Amer >60 >60 mL/min   GFR calc Af Amer >60 >60 mL/min    Comment: (NOTE) The eGFR has been calculated using the CKD EPI equation. This calculation has not been validated in all clinical situations. eGFR's persistently <60 mL/min signify possible Chronic Kidney Disease.    Anion gap 9 5 - 15    Comment: Performed at [facility], [place] [address], [place], [place] [address]  CBC     Status: None   Collection Time: 07/08/18  4:15 AM  Result Value Ref Range   WBC 4.6 4.0 - 10.5 K/uL   RBC 4.47 4.22 - 5.81 MIL/uL   Hemoglobin 14.5 13.0 - 17.0 g/dL   HCT 42.8 39.0 - 52.0 %   MCV 95.7 78.0 - 100.0 fL   MCH 32.4 26.0 - 34.0 pg   MCHC 33.9 30.0 - 36.0 g/dL   RDW 12.5 11.5 - 15.5 %   Platelets 229 150 - 400 K/uL    Comment: Performed at [facility], [place] [address], [place], [place] [address]    Current Facility-Administered Medications  Medication Dose Route Frequency Provider Last Rate Last Dose  . 0.9 %  sodium chloride infusion   Intravenous PRN Charlynne Cousins, MD   Stopped at 07/07/18 [phone]  . acetaminophen (TYLENOL) tablet 650  mg  650 mg Oral Q6H PRN Fuller Plan A, MD   650 mg at 07/07/18 0541   Or  . acetaminophen (TYLENOL) suppository 650 mg  650 mg Rectal Q6H PRN Fuller Plan A, MD      . albuterol (PROVENTIL) (2.5 MG/3ML) 0.083% nebulizer solution 2.5 mg  2.5 mg Nebulization Q6H PRN Smith, Rondell A, MD      . amitriptyline (ELAVIL) tablet 75 mg  75 mg Oral QHS Charlynne Cousins, MD   75 mg at 07/07/18 2122  . aspirin EC tablet 81 mg  81 mg Oral Daily Smith, Rondell A, MD   81 mg at 07/08/18 1003  . chlorhexidine (PERIDEX) 0.12 % solution 15 mL  15 mL Mouth Rinse BID Charlynne Cousins, MD   15 mL at 07/08/18 1003  . clindamycin (CLEOCIN) capsule 300 mg  300 mg Oral Q6H Hall, Carole N, DO   300 mg at 07/08/18 0747  . enoxaparin (LOVENOX) injection 40 mg  40 mg Subcutaneous Q24H Smith, Rondell A, MD   40 mg at 07/08/18 1003  . gabapentin (NEURONTIN) capsule 300 mg  300 mg Oral TID Irene Pap N, DO   300 mg at 07/08/18 1003  . MEDLINE mouth rinse  15 mL Mouth Rinse q12n4p Charlynne Cousins, MD   15 mL at 07/07/18 2025  . ondansetron (ZOFRAN) tablet 4 mg  4 mg Oral Q6H PRN Fuller Plan A, MD       Or  . ondansetron (ZOFRAN) injection 4 mg  4 mg Intravenous Q6H PRN Smith, Rondell A, MD      . oxyCODONE-acetaminophen (PERCOCET/ROXICET) 5-325 MG per tablet 1 tablet  1 tablet Oral Q6H PRN Norval Morton, MD   1 tablet at 07/08/18 0255  . sodium chloride flush (NS) 0.9 % injection 3 mL  3 mL Intravenous Q12H Smith, Rondell A, MD   3 mL at 07/08/18 0250  . tamsulosin (FLOMAX) capsule 0.4 mg  0.4 mg Oral Daily Smith, Rondell A, MD   0.4 mg at 07/08/18 1003  . traMADol (ULTRAM) tablet 50 mg  50 mg Oral BID Charlynne Cousins, MD   50 mg at 07/08/18 1003    Musculoskeletal: Strength & Muscle Tone: decreased due to physical deconditioning.  Gait & Station: unable to stand Patient leans: N/A  Psychiatric Specialty Exam: Physical Exam  Nursing note and vitals reviewed. Constitutional: He appears  well-developed and well-nourished.  HENT:  Head: Normocephalic and atraumatic.  Neck: Normal range of motion.  Respiratory: Effort normal.  Musculoskeletal: Normal range of motion.  Neurological: He is alert.  Only oriented to self.  Skin: No rash noted.  Psychiatric: He has a normal mood and affect. His speech is normal and behavior is normal. Judgment and thought content normal. Cognition and memory are impaired.    Review of Systems  Constitutional: Negative for chills and fever.  Cardiovascular: Negative for chest pain.  Gastrointestinal: Negative for abdominal pain, constipation, diarrhea, nausea and vomiting.  Musculoskeletal:       Right foot pain.  Psychiatric/Behavioral: Negative for depression, hallucinations, substance abuse and suicidal ideas. The patient is not nervous/anxious and does not have insomnia.   All other systems reviewed and are negative.   Blood pressure (!) 109/57, pulse (!) 59, temperature 98 F (36.7 C), resp. rate 18, height 6' 1"  (1.854 m), weight 114.7 kg, SpO2 95 %.Body mass index is 33.37 kg/m.  General Appearance: Fairly Groomed, obese, middle aged, Caucasian male, wearing a hospital gown with a baseball cap on his head and lying in bed. NAD.   Eye Contact:  Good  Speech:  Slow and aphasic  Volume:  Normal  Mood:  Euthymic  Affect:  Congruent  Thought Process:  Linear and Descriptions of Associations: Intact  Orientation:  Other:  Oriented to self and place.  Thought Content:  Logical  Suicidal Thoughts:  No  Homicidal Thoughts:  No  Memory:  Immediate;   Poor Recent;   Fair Remote;   Fair  Judgement:  Impaired  Insight:  Lacking  Psychomotor Activity:  Decreased  Concentration:  Concentration: Fair and Attention Span: Fair  Recall:  Poor  Fund of Knowledge:  Fair  Language:  Fair  Akathisia:  No  Handed:  Right  AIMS (if indicated):   N/A  Assets:  Communication Skills  ADL's:  Impaired  Cognition: Impaired due to AMS.   Sleep:    Okay   Assessment:  Joseph [name] is a 57 y.o. male who was admitted with right foot cellulitis. Psychiatry was consulted for capacity evaluation to refuse SNF placement. Patient is unable to rationally manipulate information and is unaware that he has an active medical condition or the treatment he is receiving at the hospital and therefore cannot appreciate why he is recommended for SNF placement. He is confused throughout interview and is only oriented to self. He will need an authorized representative to help him make decisions regarding placement.   Treatment Plan Summary: -Patient lacks capacity to refuse SNF placement.  -Psychiatry will sign off on patient at this time. Please consult psychiatry again as needed.  Disposition: No evidence of imminent risk to self or others at present.   Patient does not meet criteria for psychiatric inpatient admission.  Faythe Dingwall, DO 07/08/2018 12:03 PM

## 2018-07-08 NOTE — Plan of Care (Signed)
  Problem: Health Behavior/Discharge Planning: Goal: Ability to manage health-related needs will improve Outcome: Progressing   Problem: Clinical Measurements: Goal: Ability to maintain clinical measurements within normal limits will improve Outcome: Progressing Goal: Will remain free from infection Outcome: Progressing   Problem: Safety: Goal: Ability to remain free from injury will improve Outcome: Progressing   Problem: Clinical Measurements: Goal: Diagnostic test results will improve Outcome: Adequate for Discharge Goal: Respiratory complications will improve Outcome: Adequate for Discharge Goal: Cardiovascular complication will be avoided Outcome: Adequate for Discharge   Problem: Activity: Goal: Risk for activity intolerance will decrease Outcome: Adequate for Discharge   Problem: Nutrition: Goal: Adequate nutrition will be maintained Outcome: Adequate for Discharge   Problem: Coping: Goal: Level of anxiety will decrease Outcome: Adequate for Discharge   Problem: Elimination: Goal: Will not experience complications related to bowel motility Outcome: Adequate for Discharge Goal: Will not experience complications related to urinary retention Outcome: Adequate for Discharge   Problem: Pain Managment: Goal: General experience of comfort will improve Outcome: Adequate for Discharge

## 2018-07-09 DIAGNOSIS — Z008 Encounter for other general examination: Secondary | ICD-10-CM

## 2018-07-09 NOTE — Progress Notes (Signed)
Physical Therapy Treatment Patient Details Name: Joseph PullerLarry Ray [name] MRN: [id number] DOB: 08-18-1961 Today's Date: 07/09/2018    History of Present Illness 57 yo male admitted with R foot pressure injury/cellultis, encephalopathy. Hx of CVA with R side spastic hemiplegia, chronic pain    PT Comments    Pt assisted with standing and ambulated in his room.  Ambulation limited by pain and fatigue.  Pt reports he does not use assistive device at baseline.  Pt requiring min assist for stabilizing.  Continue to recommend SNF upon d/c.   Follow Up Recommendations  SNF     Equipment Recommendations  None recommended by PT    Recommendations for Other Services       Precautions / Restrictions Precautions Precautions: Fall Restrictions Weight Bearing Restrictions: No    Mobility  Bed Mobility Overal bed mobility: Needs Assistance Bed Mobility: Supine to Sit     Supine to sit: Supervision     General bed mobility comments: for safety. Pt uses momentum to complete tasks, also utilized bed rail  Transfers Overall transfer level: Needs assistance Equipment used: Rolling walker (2 wheeled) Transfers: Sit to/from Stand Sit to Stand: Min assist;From elevated surface         General transfer comment: uses momentum to self assist, verbal cues for hand placement, assist to rise and steady  Ambulation/Gait Ambulation/Gait assistance: Editor, commissioningMin assist Gait Distance (Feet): 12 Feet Assistive device: Rolling walker (2 wheeled) Gait Pattern/deviations: Step-to pattern;Decreased weight shift to right;Steppage     General Gait Details: used RW like a hemiwalker on pt's left side ( therapist assisted with moving RW), increased lean to left to advance R LE, assist to steady, pt reports increase in R heel pain with ambulating, distance to tolerance   Stairs             Wheelchair Mobility    Modified Rankin (Stroke Patients Only)       Balance                                             Cognition Arousal/Alertness: Awake/alert Behavior During Therapy: WFL for tasks assessed/performed Overall Cognitive Status: Within Functional Limits for tasks assessed                                 General Comments: Pt presents with expressive aphasia. Pt followed commands well and responded appropriately      Exercises      General Comments        Pertinent Vitals/Pain Pain Assessment: Faces Faces Pain Scale: Hurts even more Pain Location: R foot with activity/WBing Pain Descriptors / Indicators: Sore;Aching Pain Intervention(s): Limited activity within patient's tolerance;Repositioned;Monitored during session(educated pt to elevate LE, float heels)    Home Living                      Prior Function            PT Goals (current goals can now be found in the care plan section) Progress towards PT goals: Progressing toward goals    Frequency    Min 3X/week      PT Plan Current plan remains appropriate    Co-evaluation              AM-PAC PT "6 Clicks" Daily Activity  Outcome Measure  Difficulty turning over in bed (including adjusting bedclothes, sheets and blankets)?: A Lot Difficulty moving from lying on back to sitting on the side of the bed? : A Lot Difficulty sitting down on and standing up from a chair with arms (e.g., wheelchair, bedside commode, etc,.)?: Unable Help needed moving to and from a bed to chair (including a wheelchair)?: A Little Help needed walking in hospital room?: A Little Help needed climbing 3-5 steps with a railing? : Total 6 Click Score: 12    End of Session Equipment Utilized During Treatment: Gait belt Activity Tolerance: Patient limited by pain Patient left: in bed;with bed alarm set(sitting EOB, NT aware, bed alarm on) Nurse Communication: Mobility status PT Visit Diagnosis: Other abnormalities of gait and mobility (R26.89);Unsteadiness on feet (R26.81)      Time: 1100-1120 PT Time Calculation (min) (ACUTE ONLY): 20 min  Charges:  $Gait Training: 8-22 mins                     Zenovia Jarred, PT, DPT 07/09/2018 Pager: [phone]  Maida Sale E 07/09/2018, 12:38 PM

## 2018-07-09 NOTE — Progress Notes (Signed)
Patient deemed by psychiatry to not have capacity to make medial decisions.  Per chart review, PT recommending SNF. CSW followed up with patient's oldest daughter Alba Cory(Melanie Smiddy), and informed her of PT recommendation for SNF for ST rehab. Patient's daughter reported that she is agreeable to patient discharging to SNF. CSW agreed to complete FL2 and follow up with bed offers.   Celso SickleKimberly Wolfe [place], [place] Clinical Social Worker [facility] Cell#: [phone]

## 2018-07-09 NOTE — Progress Notes (Signed)
PROGRESS NOTE  Joseph [name] ZOX:[id number] DOB: October 01, 1961 DOA: 07/05/2018 PCP: Patient, No Pcp Per  HPI/Recap of past 24 hours: Joseph [name] is an 57 y.o. male past medical history of CVA residual right-sided weakness chronic pain who presents via EMS complaining of right ankle pain history was limited as the patient was encephalopathic.  Right heel pressure ulcer with right foot cellulitis.  07/07/18: Patient seen and examined at his bedside.  He reports moderate pain at his right foot.  Pain management in place.  07/08/18: Seen and examined at bedside.  A little confused.  No new motor or sensory deficits.  Right-sided weakness which is chronic from previous CVA.  Alert and oriented x2.  07/09/2018: Seen and examined at bedside.  Pain in his right foot is improved after starting gabapentin yesterday.  Has no new complaints.     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 Clindamycin Continue wound care Afebrile with no leukocytosis Reports burning sensation Suspect neuropathic pain Continue gabapentin 300 mg 3 times daily  Acute metabolic encephalopathy unclear etiology Reorient as needed Fall precautions  Hx of CVA with Right sided residual deficit Continue aspirin  Ambulatory dysfunction post CVA PT recommends SNF Fall precautions Social worker following for placement  Obesity BMI 34 Weight loss outpatient  Chronic lower extremity edema 2D echo done during this admission with normal LVEF Elevate legs as needed   Code Status: Full code  Family Communication: None at bedside  Disposition Plan: SNF possibly  tomorrow 07/10/2018 if bed available  Consultants:  Wound care specialist  Procedures:  None  Antimicrobials:  P.o. clindamycin  DVT prophylaxis: Subcu Lovenox   Objective: Vitals:   07/08/18 1346 07/08/18 2037 07/09/18 0515 07/09/18 1443  BP: (!) 118/55 103/70 (!) 109/59 135/61  Pulse: 67 67 61 67  Resp: 20 18 20 18   Temp: 98.4 F (36.9 C) 98.1 F (36.7 C) (!) 97.5 F (36.4 C) 98.9 F (37.2 C)  TempSrc: Oral Oral Oral Oral  SpO2: 94% 97% 94% 96%  Weight:   114.7 kg   Height:        Intake/Output Summary (Last 24 hours) at 07/09/2018 1626 Last data filed at 07/09/2018 1030 Gross per 24 hour  Intake 790 ml  Output 1175 ml  Net -385 ml   Filed Weights   07/05/18 2114 07/08/18 0742 07/09/18 0515  Weight: 117.1 kg 114.7 kg 114.7 kg    Exam:  . General: 57 y.o. year-old male well-developed well-nourished in no acute distress.  Alert and oriented x2.   . Cardiovascular: Regular rate and rhythm with no rubs or gallops.  No thyromegaly or JVD noted.   Marland Kitchen Respiratory: Clear to auscultation with no wheezes or rales. Good inspiratory effort. . Abdomen: Soft nontender nondistended with normal bowel sounds x4 quadrants. . Musculoskeletal: Bilateral lower extremity edema. 2/4 pulses in all 4 extremities. . Skin: Right heel pressure wound with no drainage . Psychiatry: Mood is appropriate for condition and setting   Data Reviewed: CBC: Recent Labs  Lab 07/05/18 0232 07/05/18 0304 07/08/18 0415  WBC 6.2  --  4.6  NEUTROABS 4.1  --   --  HGB 13.7 12.9* 14.5  HCT 39.9 38.0* 42.8  MCV 95.0  --  95.7  PLT 191  --  229   Basic Metabolic Panel: Recent Labs  Lab 07/05/18 0233 07/05/18 0304 07/06/18 0436 07/07/18 0412 07/08/18 0415  NA 144 143 143 141 140  K 3.3* 3.1* 4.0 3.7 4.2  CL 106 102 107 103 102  CO2 29  --  27 30 29   GLUCOSE 162* 149* 116* 101* 119*  BUN 18 17 13 15 17   CREATININE 1.13 1.10 0.92 0.92 0.89  CALCIUM 8.9  --  8.7* 8.7* 9.1    GFR: Estimated Creatinine Clearance: 123 mL/min (by C-G formula based on SCr of 0.89 mg/dL). Liver Function Tests: Recent Labs  Lab 07/05/18 0233  AST 23  ALT 17  ALKPHOS 65  BILITOT 0.7  PROT 6.6  ALBUMIN 3.6   No results for input(s): LIPASE, AMYLASE in the last 168 hours. Recent Labs  Lab 07/05/18 0607  AMMONIA 20   Coagulation Profile: No results for input(s): INR, PROTIME in the last 168 hours. Cardiac Enzymes: No results for input(s): CKTOTAL, CKMB, CKMBINDEX, TROPONINI in the last 168 hours. BNP (last 3 results) No results for input(s): PROBNP in the last 8760 hours. HbA1C: No results for input(s): HGBA1C in the last 72 hours. CBG: Recent Labs  Lab 07/05/18 0225  GLUCAP 186*   Lipid Profile: No results for input(s): CHOL, HDL, LDLCALC, TRIG, CHOLHDL, LDLDIRECT in the last 72 hours. Thyroid Function Tests: No results for input(s): TSH, T4TOTAL, FREET4, T3FREE, THYROIDAB in the last 72 hours. Anemia Panel: No results for input(s): VITAMINB12, FOLATE, FERRITIN, TIBC, IRON, RETICCTPCT in the last 72 hours. Urine analysis:    Component Value Date/Time   COLORURINE YELLOW 07/05/2018 0232   APPEARANCEUR CLEAR 07/05/2018 0232   LABSPEC 1.027 07/05/2018 0232   PHURINE 5.0 07/05/2018 0232   GLUCOSEU NEGATIVE 07/05/2018 0232   GLUCOSEU NEGATIVE 08/09/2014 1647   HGBUR NEGATIVE 07/05/2018 0232   BILIRUBINUR NEGATIVE 07/05/2018 0232   BILIRUBINUR neg 03/10/2012 1602   KETONESUR NEGATIVE 07/05/2018 0232   PROTEINUR NEGATIVE 07/05/2018 0232   UROBILINOGEN 1.0 08/09/2014 1647   NITRITE NEGATIVE 07/05/2018 0232   LEUKOCYTESUR NEGATIVE 07/05/2018 0232   Sepsis Labs: @LABRCNTIP (procalcitonin:4,lacticidven:4)  ) Recent Results (from the past 240 hour(s))  Culture, blood (routine x 2)     Status: None (Preliminary result)   Collection Time: 07/05/18  6:50 AM  Result Value Ref Range Status   Specimen Description   Final    BLOOD LEFT ANTECUBITAL Performed at  [facility], [address], [place], [place] [address]    Special Requests   Final    BOTTLES DRAWN AEROBIC AND ANAEROBIC Blood Culture adequate volume Performed at [facility], [address] [place], [place] [address]    Culture   Final    NO GROWTH 4 DAYS Performed at [facility], [address], [place], [place] [address]    Report Status PENDING  Incomplete  Culture, blood (routine x 2)     Status: None (Preliminary result)   Collection Time: 07/05/18  6:50 AM  Result Value Ref Range Status   Specimen Description   Final    BLOOD RIGHT ANTECUBITAL Performed at [facility], [address], [place], [place] [address]    Special Requests   Final    BOTTLES DRAWN AEROBIC AND ANAEROBIC Blood Culture adequate volume Performed at [facility], [address]  [place], [place] [address]    Culture   Final    NO GROWTH 4 DAYS Performed at [facility], [address] [place], [place] [address]    Report Status PENDING  Incomplete      Studies: No results found.  Scheduled Meds: . amitriptyline  75 mg Oral QHS  . aspirin EC  81 mg Oral Daily  . chlorhexidine  15 mL Mouth Rinse BID  . clindamycin  300 mg Oral Q6H  . enoxaparin (LOVENOX) injection  40 mg Subcutaneous Q24H  . gabapentin  300 mg Oral TID  . mouth rinse  15 mL Mouth Rinse q12n4p  . sodium chloride flush  3 mL Intravenous Q12H  . tamsulosin  0.4 mg Oral Daily  . traMADol  50 mg Oral BID    Continuous Infusions: . sodium chloride Stopped (07/07/18 0538)     LOS: 4 days     Darlin Droparole N Tere Mcconaughey, MD [facility] Pager [phone]  If 7PM-7AM, please contact night-coverage [email or web address] Password [email or web address] 07/09/2018, 4:26 PM

## 2018-07-09 NOTE — Clinical Social Work Placement (Signed)
CSW provided bed offers to patient's daughter Alba Cory(Melanie Mecham). Patient's daughter reported that she was going to speak with her sisters about SNF options and follow up with CSW with SNF selection.  CLINICAL SOCIAL WORK PLACEMENT  NOTE  Date:  07/09/2018  Patient Details  Name: Katheren PullerLarry Ray Raigoza MRN: [id number] Date of Birth: [birth year]  Clinical Social Work is seeking post-discharge placement for this patient at the Skilled  Nursing Facility level of care (*CSW will initial, date and re-position this form in  chart as items are completed):  Yes   Patient/family provided with [facility] Clinical Social Work Department's list of facilities offering this level of care within the geographic area requested by the patient (or if unable, by the patient's family).  Yes   Patient/family informed of their freedom to choose among providers that offer the needed level of care, that participate in Medicare, Medicaid or managed care program needed by the patient, have an available bed and are willing to accept the patient.  Yes   Patient/family informed of [facility]'s ownership interest in [facility] and [facility], as well as of the fact that they are under no obligation to receive care at these facilities.  PASRR submitted to EDS on       PASRR number received on       Existing PASRR number confirmed on 07/09/18     FL2 transmitted to all facilities in geographic area requested by pt/family on 07/09/18     FL2 transmitted to all facilities within larger geographic area on       Patient informed that his/her managed care company has contracts with or will negotiate with certain facilities, including the following:        Yes   Patient/family informed of bed offers received.  Patient chooses bed at       Physician recommends and patient chooses bed at      Patient to be transferred to   on  .  Patient to be transferred to facility by       Patient family notified on   of  transfer.  Name of family member notified:        PHYSICIAN       Additional Comment:    _______________________________________________ Antionette PolesKimberly L Milan Clare, LCSW 07/09/2018, 2:14 PM

## 2018-07-09 NOTE — [place] FL2 (Signed)
[place] MEDICAID FL2 LEVEL OF CARE SCREENING TOOL     IDENTIFICATION  Patient Name: Joseph PullerLarry Ray [name] Birthdate: 09-30-1961 Sex: male Admission Date (Current Location): 07/05/2018  [facility] [place]   Facility and Address:  [facility],  [address], [place] [address]      Provider Number: [id number]  Attending Physician Name and Address:  Darlin DropHall, Carole N, DO  Relative Name and Phone Number:       Current Level of Care: Hospital Recommended Level of Care: Skilled Nursing Facility Prior Approval Number:    Date Approved/Denied:   PASRR Number: [id number]  Discharge Plan: SNF    Current Diagnoses: Patient Active Problem List   Diagnosis Date Noted  . Evaluation by psychiatric service required   . Acute encephalopathy 07/05/2018  . Cellulitis of right foot 07/05/2018  . Peripheral edema 07/05/2018  . History of CVA with residual deficit 07/05/2018  . Pressure ulcer of right heel, stage 2 07/05/2018  . 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 Closed rib fracture 02/22/2015  . Hyperlipidemia with target LDL less than 130 04/10/2014  . Degenerative joint disease of knee, right 11/07/2013  . Insomnia 02/28/2013  . Routine general medical examination at a health care facility 02/28/2013  . BPH (benign prostatic hyperplasia) 03/10/2012  . Other abnormal glucose 01/30/2012  . Allergic rhinitis, cause unspecified 01/30/2012  . Pure hyperglyceridemia 06/30/2011  . HEMATURIA UNSPECIFIED 12/31/2010  . Cerebral artery occlusion with cerebral infarction (HCC) 12/30/2010  . GERD 12/30/2010    Orientation RESPIRATION BLADDER Height & Weight     Self, Place  Normal Continent Weight: 252 lb 13.9 oz (114.7 kg) Height:  6\' 1"  (185.4 cm)  BEHAVIORAL SYMPTOMS/MOOD NEUROLOGICAL BOWEL NUTRITION STATUS      Continent Diet(Heart)  AMBULATORY STATUS COMMUNICATION OF NEEDS Skin   Extensive Assist Verbally Other  (Comment)(PressureInjuryUnstageable Location: Heel Location Orientation: Right;Lateral  Foam/Betadine Dressing changed daily;   PressureInjuryUnstageable, Right Posterior elbow, Foam Dressing    )                       Personal Care Assistance Level of Assistance  Bathing, Feeding, Dressing Bathing Assistance: Maximum assistance Feeding assistance: Independent Dressing Assistance: Maximum assistance     Functional Limitations Info  Sight, Hearing, Speech Sight Info: Impaired Hearing Info: Adequate Speech Info: Impaired    SPECIAL CARE FACTORS FREQUENCY  PT (By licensed PT), OT (By licensed OT)     PT Frequency: 5x/week OT Frequency: 5x/week            Contractures      Additional Factors Info  Code Status, Allergies Code Status Info: Full Code Allergies Info: Penicillins           Current Medications (07/09/2018):  This is the current hospital active medication list Current Facility-Administered Medications  Medication Dose Route Frequency Provider Last Rate Last Dose  . 0.9 %  sodium chloride infusion   Intravenous PRN Marinda ElkFeliz Ortiz, Abraham, MD   Stopped at 07/07/18 [phone]  . acetaminophen (TYLENOL) tablet 650 mg  650 mg Oral Q6H PRN Madelyn FlavorsSmith, Rondell A, MD   650 mg at 07/07/18 0541   Or  . acetaminophen (TYLENOL) suppository 650 mg  650 mg Rectal Q6H PRN Smith, Rondell A, MD      . albuterol (PROVENTIL) (2.5 MG/3ML) 0.083% nebulizer solution 2.5 mg  2.5 mg Nebulization Q6H PRN Clydie BraunSmith, Rondell A, MD      .  amitriptyline (ELAVIL) tablet 75 mg  75 mg Oral QHS Marinda Elk, MD   75 mg at 07/08/18 2033  . aspirin EC tablet 81 mg  81 mg Oral Daily Smith, Rondell A, MD   81 mg at 07/08/18 1003  . chlorhexidine (PERIDEX) 0.12 % solution 15 mL  15 mL Mouth Rinse BID Marinda Elk, MD   15 mL at 07/08/18 1003  . clindamycin (CLEOCIN) capsule 300 mg  300 mg Oral Q6H Hall, Carole N, DO   300 mg at 07/09/18 0527  . enoxaparin (LOVENOX) injection 40 mg  40 mg  Subcutaneous Q24H Smith, Rondell A, MD   40 mg at 07/08/18 1003  . gabapentin (NEURONTIN) capsule 300 mg  300 mg Oral TID Dow Adolph N, DO   300 mg at 07/08/18 2033  . MEDLINE mouth rinse  15 mL Mouth Rinse q12n4p Marinda Elk, MD   15 mL at 07/07/18 2025  . ondansetron (ZOFRAN) tablet 4 mg  4 mg Oral Q6H PRN Madelyn Flavors A, MD       Or  . ondansetron (ZOFRAN) injection 4 mg  4 mg Intravenous Q6H PRN Smith, Rondell A, MD      . oxyCODONE-acetaminophen (PERCOCET/ROXICET) 5-325 MG per tablet 1 tablet  1 tablet Oral Q6H PRN Clydie Braun, MD   1 tablet at 07/08/18 2308  . sodium chloride flush (NS) 0.9 % injection 3 mL  3 mL Intravenous Q12H Smith, Rondell A, MD   3 mL at 07/08/18 2034  . tamsulosin (FLOMAX) capsule 0.4 mg  0.4 mg Oral Daily Smith, Rondell A, MD   0.4 mg at 07/08/18 1003  . traMADol (ULTRAM) tablet 50 mg  50 mg Oral BID Marinda Elk, MD   50 mg at 07/08/18 2033     Discharge Medications: Please see discharge summary for a list of discharge medications.  Relevant Imaging Results:  Relevant Lab Results:   Additional Information SSN [id number]  Antionette Poles, LCSW

## 2018-07-10 LAB — CULTURE, BLOOD (ROUTINE X 2)
CULTURE: NO GROWTH
Culture: NO GROWTH
SPECIAL REQUESTS: ADEQUATE
Special Requests: ADEQUATE

## 2018-07-10 MED ORDER — GABAPENTIN 300 MG PO CAPS: 300.0000 mg | ORAL_CAPSULE | Freq: Three times a day (TID) | ORAL | 0 refills | Status: AC

## 2018-07-10 NOTE — Discharge Instructions (Signed)

## 2018-07-10 NOTE — Discharge Summary (Signed)
Discharge Summary  Joseph PullerLarry Ray [name] ZOX:[id number] DOB: [birth year]  PCP: Patient, No Pcp Per  Admit date: 07/05/2018 Discharge date: 07/10/2018  Time spent: 25 minutes  Recommendations for Outpatient Follow-up:  1. Follow-up with PCP outpatient 2. Take your medications as prescribed 3. Continue PT 4. Fall precautions  Recommendations per wound care specialist: WOC Nurse wound consult note Reason for Consult: heel and back Nurse reports he has a bruise on his back Wound type: right heel pressure injury Pressure Injury POA: Yes Measurement: 3cm x 3cm x 0cm  Wound bed: 100% eschar Drainage (amount, consistency, odor) serosanguineous  Periwound: edema, distal pulses palpable Patient report extreme numbness and tingling worse in the right foot.  Unclear if he has neuropathy, and if this is related to the pressure injury.  He is unable to tell me much about this ulcer, didn't know it was present.  He is wearing bedroom shoes all the time because of his foot swelling.  Dressing procedure/placement/frequency: Heel is stable at this point, minimal drainage Paint heel with betadine, allow to air dry. Cover with silicone foam dressing Float heel on pillows.   Discharge Diagnoses:  Active Hospital Problems   Diagnosis Date Noted  . Evaluation by psychiatric service required   . Acute encephalopathy 07/05/2018  . Cellulitis of right foot 07/05/2018  . Peripheral edema 07/05/2018  . History of CVA with residual deficit 07/05/2018  . Pressure ulcer of right heel, stage 2 07/05/2018  . 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 BPH (benign prostatic hyperplasia) 03/10/2012    Resolved Hospital Problems  No resolved problems to display.    Discharge Condition: Stable  Diet recommendation: Resume previous diet  Vitals:   07/09/18 1954 07/10/18 0454  BP: (!) 107/54 127/66  Pulse: 65 66  Resp: 18 18  Temp: 98.3 F (36.8 C) 98 F (36.7 C)  SpO2: 96% 97%     History of present illness:  Joseph CorneaLarry Ray Joseph [name] an 57 y.o.malepast medical history of CVA residual right-sided weakness chronic pain who presents via EMS complaining of right ankle pain history was limited as the patient was encephalopathic. Right heel pressure ulcer with right foot cellulitis.  Hospital course complicated by intractable pain in his right foot and poly-neuropathic pain which improved after starting gabapentin.  07/10/2018: Patient seen and examined at his bedside.  No acute events overnight.  No new complaints.  On the day of discharge, the patient was hemodynamically stable.  He will need to follow-up with his primary care provider and continue physical therapy.  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)  Right heel pressure wound with cellulitis Completed 6 days of antibiotics Continue wound care dressing Continue gabapentin 300 mg 3 times daily  Polyneuropathy Started gabapentin during this admission which he tolerated well Follow-up with your PCP  Acute metabolic encephalopathy suspect secondary to acute illness, resolved  Hx of CVA with Right sided residual deficits Continue aspirin Fall precautions  Ambulatory dysfunction post CVA PT recommends SNF Fall precautions  Obesity BMI 34 Weight loss outpatient  Chronic lower extremity edema 2D echo done during this admission with normal LVEF Elevate legs as needed  Procedures:  None  Consultations:  Wound care specialist  Discharge Exam: BP 127/66 (BP Location: Left Arm)   Pulse 66   Temp 98 F (36.7 C)   Resp 18   Ht 6\' 1"  (1.854 m)   Wt 116.2 kg   SpO2 97%   BMI 33.80 kg/m   . General: 57 y.o. year-old male well developed well nourished in no acute distress.  Alert and oriented x2. . Cardiovascular: Regular rate and rhythm with no rubs or gallops.  No thyromegaly or JVD noted.   Marland Kitchen Respiratory: Clear to auscultation with no wheezes or rales. Good inspiratory effort. . Abdomen: Soft nontender nondistended with normal bowel sounds x4 quadrants. . Musculoskeletal: No lower extremity edema. 2/4 pulses in all 4 extremities. . Skin: Right heel pressure wound with no drainage.   Marland Kitchen Psychiatry: Mood is appropriate for condition and setting  Discharge Instructions You were cared for by a hospitalist during your hospital stay. If you have any questions about your discharge medications or the care you received while you were in the hospital after you are discharged, you can call the unit and asked to speak with the hospitalist on call if the hospitalist that took care of you is not available. Once you are discharged, your primary care physician will handle any further medical issues. Please note that NO REFILLS for any discharge medications will be authorized once you are discharged, as it is imperative that you return to your primary care physician (or establish a relationship with a primary care physician if you do not have one) for your aftercare needs so that they can reassess your need for medications and monitor your lab values.   Allergies as of 07/10/2018      Reactions   Penicillins Nausea And Vomiting   Has patient had a PCN reaction causing immediate rash, facial/tongue/throat swelling, SOB or lightheadedness with hypotension: No Has patient had a PCN reaction causing severe rash involving mucus membranes or skin necrosis: No Has patient had a PCN reaction that required hospitalization: No Has patient had a PCN reaction occurring within the last 10 years: Unknown If all of the above answers are "NO", then may proceed with Cephalosporin use.      Medication List     STOP taking these medications   amitriptyline 50 MG tablet Commonly known as:  ELAVIL   oxyCODONE-acetaminophen 5-325 MG tablet Commonly known as:  PERCOCET/ROXICET   traMADol 50 MG tablet Commonly known as:  ULTRAM     TAKE these medications   aspirin EC 81 MG tablet Take 1 tablet (81 mg total) by mouth daily.   fluticasone 50 MCG/ACT nasal spray Commonly known as:  FLONASE instill 2 sprays into each nostril daily   gabapentin 300 MG capsule Commonly known as:  NEURONTIN Take 1 capsule (300 mg total) by mouth 3 (three) times daily.   tamsulosin 0.4 MG Caps capsule Commonly known as:  FLOMAX Take 1 capsule (0.4 mg total) by mouth daily.      Allergies  Allergen Reactions  . Penicillins Nausea And Vomiting    Has patient had a PCN reaction causing immediate rash, facial/tongue/throat swelling, SOB or lightheadedness with hypotension: No Has patient had a PCN reaction causing severe rash involving mucus membranes or skin necrosis: No Has patient had a PCN reaction that required hospitalization: No Has patient had a PCN reaction occurring within the last 10 years: Unknown If all of the above answers are "NO", then may proceed with Cephalosporin use.  The results of significant diagnostics from this hospitalization (including imaging, microbiology, ancillary and laboratory) are listed below for reference.    Significant Diagnostic Studies: Ct Head Wo Contrast  Result Date: 07/05/2018 CLINICAL DATA:  Acute onset of altered mental status. EXAM: CT HEAD WITHOUT CONTRAST TECHNIQUE: Contiguous axial images were obtained from the base of the skull through the vertex without intravenous contrast. COMPARISON:  CT of the head performed 03/14/2010 FINDINGS: Brain: No evidence of acute infarction, hemorrhage, hydrocephalus, extra-axial collection or mass lesion / mass effect. Postoperative change is noted at the left cerebral hemisphere, with ex vacuo dilatation of the left  lateral ventricle. Scattered periventricular white matter change likely reflects small vessel ischemic microangiopathy. The brainstem and fourth ventricle are within normal limits. The basal ganglia are unremarkable in appearance. No mass effect or midline shift is seen. Vascular: No hyperdense vessel or unexpected calcification. Skull: There is no evidence of fracture; a craniotomy flap is noted at the left parietal calvarium. Sinuses/Orbits: The orbits are within normal limits. The paranasal sinuses and mastoid air cells are well-aerated. Other: No significant soft tissue abnormalities are seen. IMPRESSION: 1. No acute intracranial pathology seen on CT. Postoperative change noted on the left. 2. Scattered small vessel ischemic microangiopathy. Electronically Signed   By: Roanna Raider M.D.   On: 07/05/2018 03:46   Dg Chest Portable 1 View  Result Date: 07/05/2018 CLINICAL DATA:  Altered mental status EXAM: PORTABLE CHEST 1 VIEW COMPARISON:  03/06/2015 FINDINGS: Stable borderline cardiomegaly with central pulmonary vascular congestion compatible with mild CHF. Low lung volumes without pulmonary consolidation. No pneumothorax. No acute osseous abnormality. IMPRESSION: Stable borderline cardiomegaly with mild central pulmonary vascular congestion. Electronically Signed   By: Tollie Eth M.D.   On: 07/05/2018 02:59   Dg Foot 2 Views Right  Result Date: 07/05/2018 CLINICAL DATA:  Acute onset of right foot and ankle pain. EXAM: RIGHT FOOT - 2 VIEW COMPARISON:  None. FINDINGS: There is no evidence of fracture or dislocation. The joint spaces are preserved. There is no evidence of talar subluxation; the subtalar joint is unremarkable in appearance. Mild soft tissue swelling is noted about the forefoot and midfoot. IMPRESSION: No evidence of fracture or dislocation. Electronically Signed   By: Roanna Raider M.D.   On: 07/05/2018 06:38    Microbiology: Recent Results (from the past 240 hour(s))  Culture, blood  (routine x 2)     Status: None   Collection Time: 07/05/18  6:50 AM  Result Value Ref Range Status   Specimen Description   Final    BLOOD LEFT ANTECUBITAL Performed at [facility], [address], [place], [place] [address]    Special Requests   Final    BOTTLES DRAWN AEROBIC AND ANAEROBIC Blood Culture adequate volume Performed at [facility], [address] [place], [place] [address]    Culture   Final    NO GROWTH 5 DAYS Performed at [facility], [address] [place], [place] [address]    Report Status 07/10/2018 FINAL  Final  Culture, blood (routine x 2)     Status: None   Collection Time: 07/05/18  6:50 AM  Result Value Ref Range Status   Specimen Description   Final    BLOOD RIGHT ANTECUBITAL Performed at [facility], [address], [place], [place] [address]    Special Requests   Final    BOTTLES DRAWN AEROBIC AND ANAEROBIC Blood Culture adequate volume Performed at [facility], [address] [place], [place] [address]    Culture   Final    NO GROWTH 5 DAYS Performed at [facility], [address] [place], [place] [address]    Report Status 07/10/2018 FINAL  Final     Labs: Basic Metabolic Panel: Recent Labs  Lab 07/05/18 0233 07/05/18 0304 07/06/18 0436 07/07/18 0412 07/08/18 0415  NA 144 143 143 141 140  K 3.3* 3.1* 4.0 3.7 4.2  CL 106 102 107 103 102  CO2 29  --  27 30 29   GLUCOSE 162* 149* 116* 101* 119*  BUN 18 17 13 15 17   CREATININE 1.13 1.10 0.92 0.92 0.89  CALCIUM 8.9  --  8.7* 8.7* 9.1   Liver Function Tests: Recent Labs  Lab 07/05/18 0233  AST 23  ALT 17  ALKPHOS 65  BILITOT 0.7  PROT 6.6  ALBUMIN 3.6   No results for input(s): LIPASE, AMYLASE in the last 168 hours. Recent Labs  Lab 07/05/18 0607  AMMONIA 20   CBC: Recent Labs  Lab 07/05/18 0232 07/05/18 0304 07/08/18 0415  WBC 6.2  --  4.6  NEUTROABS 4.1  --   --   HGB  13.7 12.9* 14.5  HCT 39.9 38.0* 42.8  MCV 95.0  --  95.7  PLT 191  --  229   Cardiac Enzymes: No results for input(s): CKTOTAL, CKMB, CKMBINDEX, TROPONINI in the last 168 hours. BNP: BNP (last 3 results) No results for input(s): BNP in the last 8760 hours.  ProBNP (last 3 results) No results for input(s): PROBNP in the last 8760 hours.  CBG: Recent Labs  Lab 07/05/18 0225  GLUCAP 186*       Signed:  Darlin Drop, MD [facility] 07/10/2018, 1:17 PM

## 2018-07-10 NOTE — Clinical Social Work Placement (Signed)
[facility]'s [facility]. CSW confirmed bed at facility. CSW faxed docs to facility. Patient's family aware of d/c. Patient will transport by [facility].   RN report #: [id number]  CLINICAL SOCIAL WORK PLACEMENT  NOTE  Date:  07/10/2018  Patient Details  Name: Joseph [name] MRN: [id number] Date of Birth: [birth year]  Clinical Social Work is seeking post-discharge placement for this patient at the Skilled  Nursing Facility level of care (*CSW will initial, date and re-position this form in  chart as items are completed):  Yes   Patient/family provided with [place] Clinical Social Work Department's list of facilities offering this level of care within the geographic area requested by the patient (or if unable, by the patient's family).  Yes   Patient/family informed of their freedom to choose among providers that offer the needed level of care, that participate in Medicare, Medicaid or managed care program needed by the patient, have an available bed and are willing to accept the patient.  Yes   Patient/family informed of 's ownership interest in [facility] and [facility], as well as of the fact that they are under no obligation to receive care at these facilities.  PASRR submitted to EDS on       PASRR number received on       Existing PASRR number confirmed on 07/09/18     FL2 transmitted to all facilities in geographic area requested by pt/family on 07/09/18     FL2 transmitted to all facilities within larger geographic area on       Patient informed that his/her managed care company has contracts with or will negotiate with certain facilities, including the following:        Yes   Patient/family informed of bed offers received.  [facility]     Physician recommends and patient [facility]    [facility].  Patient to  be transferred to facility by [facility]     Patient family notified on 07/10/18 of transfer.  Name of family member notified:  Joseph [name]     PHYSICIAN       Additional Comment:    _______________________________________________ Enid CutterLindsey Carah Barrientes, LCSW 07/10/2018, 3:26 PM

## 2019-11-01 DEATH — deceased
